# Patient Record
Sex: Male | Born: 1950 | Race: White | Hispanic: No | Marital: Married | Smoking: Former smoker
Health system: Southern US, Community
[De-identification: ages and names within clinical notes are randomized; demographics above are authoritative.]

## PROBLEM LIST (undated history)

## (undated) DIAGNOSIS — T80219A Unspecified infection due to central venous catheter, initial encounter: Secondary | ICD-10-CM

## (undated) DIAGNOSIS — K3184 Gastroparesis: Secondary | ICD-10-CM

## (undated) DIAGNOSIS — E119 Type 2 diabetes mellitus without complications: Secondary | ICD-10-CM

## (undated) DIAGNOSIS — L97509 Non-pressure chronic ulcer of other part of unspecified foot with unspecified severity: Secondary | ICD-10-CM

## (undated) DIAGNOSIS — Z720 Tobacco use: Secondary | ICD-10-CM

## (undated) DIAGNOSIS — I358 Other nonrheumatic aortic valve disorders: Secondary | ICD-10-CM

## (undated) DIAGNOSIS — R9430 Abnormal result of cardiovascular function study, unspecified: Secondary | ICD-10-CM

## (undated) DIAGNOSIS — I2510 Atherosclerotic heart disease of native coronary artery without angina pectoris: Secondary | ICD-10-CM

## (undated) DIAGNOSIS — I739 Peripheral vascular disease, unspecified: Secondary | ICD-10-CM

## (undated) HISTORY — DX: Abnormal result of cardiovascular function study, unspecified: R94.30

## (undated) HISTORY — DX: Tobacco use: Z72.0

## (undated) HISTORY — DX: Unspecified infection due to central venous catheter, initial encounter: T80.219A

## (undated) HISTORY — DX: Other nonrheumatic aortic valve disorders: I35.8

## (undated) HISTORY — DX: Peripheral vascular disease, unspecified: I73.9

## (undated) HISTORY — PX: CHOLECYSTECTOMY: SHX55

## (undated) HISTORY — PX: TONSILLECTOMY: SUR1361

## (undated) HISTORY — DX: Type 2 diabetes mellitus without complications: E11.9

## (undated) HISTORY — DX: Non-pressure chronic ulcer of other part of unspecified foot with unspecified severity: L97.509

## (undated) HISTORY — PX: VASECTOMY: SHX75

## (undated) HISTORY — DX: Gastroparesis: K31.84

## (undated) HISTORY — DX: Atherosclerotic heart disease of native coronary artery without angina pectoris: I25.10

## (undated) HISTORY — PX: KNEE SURGERY: SHX244

## (undated) HISTORY — PX: CATARACT EXTRACTION: SUR2

---

## 2010-06-07 ENCOUNTER — Ambulatory Visit: Payer: Self-pay | Admitting: Vascular Surgery

## 2010-06-07 ENCOUNTER — Encounter: Payer: Self-pay | Admitting: Cardiology

## 2010-06-28 ENCOUNTER — Ambulatory Visit (HOSPITAL_COMMUNITY)
Admission: RE | Admit: 2010-06-28 | Discharge: 2010-06-28 | Payer: Self-pay | Source: Home / Self Care | Attending: Surgery | Admitting: Surgery

## 2010-06-28 ENCOUNTER — Telehealth (INDEPENDENT_AMBULATORY_CARE_PROVIDER_SITE_OTHER): Payer: Self-pay | Admitting: Radiology

## 2010-06-29 ENCOUNTER — Encounter: Payer: Self-pay | Admitting: Cardiology

## 2010-06-29 ENCOUNTER — Encounter (HOSPITAL_COMMUNITY)
Admission: RE | Admit: 2010-06-29 | Discharge: 2010-08-16 | Payer: Self-pay | Source: Home / Self Care | Attending: Vascular Surgery | Admitting: Vascular Surgery

## 2010-06-29 ENCOUNTER — Ambulatory Visit: Payer: Self-pay

## 2010-06-29 ENCOUNTER — Encounter: Payer: Self-pay | Admitting: *Deleted

## 2010-06-30 ENCOUNTER — Encounter: Payer: Self-pay | Admitting: Cardiology

## 2010-06-30 DIAGNOSIS — E119 Type 2 diabetes mellitus without complications: Secondary | ICD-10-CM | POA: Insufficient documentation

## 2010-06-30 DIAGNOSIS — K3184 Gastroparesis: Secondary | ICD-10-CM

## 2010-07-01 ENCOUNTER — Encounter: Payer: Self-pay | Admitting: Cardiology

## 2010-07-01 ENCOUNTER — Ambulatory Visit: Payer: Self-pay

## 2010-07-01 ENCOUNTER — Ambulatory Visit: Payer: Self-pay | Admitting: Cardiology

## 2010-07-01 ENCOUNTER — Ambulatory Visit (HOSPITAL_COMMUNITY)
Admission: RE | Admit: 2010-07-01 | Discharge: 2010-07-01 | Payer: Self-pay | Source: Home / Self Care | Attending: Cardiology | Admitting: Cardiology

## 2010-07-01 DIAGNOSIS — R011 Cardiac murmur, unspecified: Secondary | ICD-10-CM

## 2010-07-15 ENCOUNTER — Inpatient Hospital Stay (HOSPITAL_COMMUNITY)
Admission: RE | Admit: 2010-07-15 | Discharge: 2010-07-21 | Payer: Self-pay | Source: Home / Self Care | Attending: Vascular Surgery | Admitting: Vascular Surgery

## 2010-07-20 LAB — GLUCOSE, CAPILLARY
Glucose-Capillary: 372 mg/dL — ABNORMAL HIGH (ref 70–99)
Glucose-Capillary: 378 mg/dL — ABNORMAL HIGH (ref 70–99)
Glucose-Capillary: 297 mg/dL — ABNORMAL HIGH (ref 70–99)
Glucose-Capillary: 177 mg/dL — ABNORMAL HIGH (ref 70–99)
Glucose-Capillary: 326 mg/dL — ABNORMAL HIGH (ref 70–99)

## 2010-07-20 LAB — CBC
HCT: 28.8 % — ABNORMAL LOW (ref 39.0–52.0)
Hemoglobin: 9.7 g/dL — ABNORMAL LOW (ref 13.0–17.0)
MCH: 30.4 pg (ref 26.0–34.0)
MCHC: 33.7 g/dL (ref 30.0–36.0)
MCV: 90.3 fL (ref 78.0–100.0)
Platelets: 221 10*3/uL (ref 150–400)
RBC: 3.19 MIL/uL — ABNORMAL LOW (ref 4.22–5.81)
RDW: 14.6 % (ref 11.5–15.5)
WBC: 14.8 10*3/uL — ABNORMAL HIGH (ref 4.0–10.5)

## 2010-07-20 LAB — HEMOCCULT GUIAC POC 1CARD (OFFICE): Fecal Occult Bld: NEGATIVE

## 2010-07-21 LAB — HEMOGLOBIN A1C
Hgb A1c MFr Bld: 7.4 % — ABNORMAL HIGH (ref <5.7)
Mean Plasma Glucose: 166 mg/dL — ABNORMAL HIGH (ref <117)

## 2010-07-21 LAB — GLUCOSE, CAPILLARY
Glucose-Capillary: 447 mg/dL — ABNORMAL HIGH (ref 70–99)
Glucose-Capillary: 291 mg/dL — ABNORMAL HIGH (ref 70–99)

## 2010-08-01 ENCOUNTER — Ambulatory Visit: Admit: 2010-08-01 | Payer: Self-pay | Admitting: Vascular Surgery

## 2010-08-08 ENCOUNTER — Ambulatory Visit
Admission: RE | Admit: 2010-08-08 | Discharge: 2010-08-08 | Payer: Self-pay | Source: Home / Self Care | Attending: Vascular Surgery | Admitting: Vascular Surgery

## 2010-08-08 ENCOUNTER — Ambulatory Visit: Admit: 2010-08-08 | Payer: Self-pay | Admitting: Vascular Surgery

## 2010-08-09 NOTE — Assessment & Plan Note (Signed)
OFFICE VISIT  Walter Hinton, Walter Hinton DOB:  1950-07-24                                       08/08/2010 [id number]#:[id number]  The patient returns today for post aortobifemoral bypass graft done by me on December 30th for a gangrenous ulcer on the right first toe.  His aortobifemoral grafts continue to function nicely.  He has had slow improvement in his appetite because of the chronic gastroparesis but no different than his preoperative state.  He has had no chills, fever but states that both feet are much warmer than before.  Bowel habits have returned to normal.  PHYSICAL EXAMINATION:  Today, blood pressure 115/71, heart rate 91, respirations 18.  Incisions have all healed nicely.  Abdominal clips were removed.  He has 3+ femoral pulses bilaterally, with no popliteal or distal pulses.  He does have a gangrenous ulcer on the medial aspect of the right first toe which is quite extensive but no surrounding cellulitis or fluctuance.  He has documented osteomyelitis in the distal phalanx previously.  I think the plan will be to proceed with right femoral-popliteal bypass grafting and a right first toe amputation to be done next Thursday, February 2nd.  The risks and benefits have been thoroughly discussed with the patient.  He would like to proceed.    Quita Skye Hart Rochester, M.D. Electronically Signed  JDL/MEDQ  D:  08/08/2010  T:  08/09/2010  Job:  [id number]

## 2010-08-16 LAB — URINE MICROSCOPIC-ADD ON

## 2010-08-16 LAB — COMPREHENSIVE METABOLIC PANEL
ALT: 19 U/L (ref 0–53)
AST: 28 U/L (ref 0–37)
Albumin: 3.5 g/dL (ref 3.5–5.2)
Alkaline Phosphatase: 119 U/L — ABNORMAL HIGH (ref 39–117)
BUN: 16 mg/dL (ref 6–23)
CO2: 24 mEq/L (ref 19–32)
Calcium: 9.1 mg/dL (ref 8.4–10.5)
Chloride: 103 mEq/L (ref 96–112)
Creatinine, Ser: 0.92 mg/dL (ref 0.4–1.5)
GFR calc Af Amer: >60 mL/min (ref >60)
GFR calc non Af Amer: >60 mL/min (ref >60)
Glucose, Bld: 306 mg/dL — ABNORMAL HIGH (ref 70–99)
Potassium: 4.3 mEq/L (ref 3.5–5.1)
Sodium: 136 mEq/L (ref 135–145)
Total Bilirubin: 0.2 mg/dL — ABNORMAL LOW (ref 0.3–1.2)
Total Protein: 6.2 g/dL (ref 6.0–8.3)

## 2010-08-16 LAB — URINALYSIS, ROUTINE W REFLEX MICROSCOPIC
Hgb urine dipstick: NEGATIVE
Ketones, ur: 15 mg/dL — AB
Leukocytes, UA: NEGATIVE
Nitrite: NEGATIVE
Protein, ur: NEGATIVE mg/dL
Specific Gravity, Urine: 1.026 (ref 1.005–1.030)
Urine Glucose, Fasting: >1000 mg/dL — AB
Urobilinogen, UA: 0.2 mg/dL (ref 0.0–1.0)
pH: 6 (ref 5.0–8.0)

## 2010-08-16 LAB — CBC
HCT: 33.7 % — ABNORMAL LOW (ref 39.0–52.0)
Hemoglobin: 10.9 g/dL — ABNORMAL LOW (ref 13.0–17.0)
MCH: 29.9 pg (ref 26.0–34.0)
MCHC: 32.3 g/dL (ref 30.0–36.0)
MCV: 92.6 fL (ref 78.0–100.0)
Platelets: 267 10*3/uL (ref 150–400)
RBC: 3.64 MIL/uL — ABNORMAL LOW (ref 4.22–5.81)
RDW: 15 % (ref 11.5–15.5)
WBC: 7.9 10*3/uL (ref 4.0–10.5)

## 2010-08-16 LAB — PROTIME-INR
INR: 1 (ref 0.00–1.49)
Prothrombin Time: 13.4 seconds (ref 11.6–15.2)

## 2010-08-16 LAB — SURGICAL PCR SCREEN
MRSA, PCR: NEGATIVE
Staphylococcus aureus: NEGATIVE

## 2010-08-16 LAB — APTT: aPTT: >200 seconds (ref 24–37)

## 2010-08-18 ENCOUNTER — Inpatient Hospital Stay (HOSPITAL_COMMUNITY)
Admission: RE | Admit: 2010-08-18 | Discharge: 2010-08-21 | DRG: 796 | Disposition: A | Discharge status: Home / Self Care | Payer: Federal, State, Local not specified - PPO | Attending: Vascular Surgery | Admitting: Vascular Surgery

## 2010-08-18 ENCOUNTER — Other Ambulatory Visit: Payer: Self-pay | Admitting: Vascular Surgery

## 2010-08-18 ENCOUNTER — Inpatient Hospital Stay (HOSPITAL_COMMUNITY): Payer: Federal, State, Local not specified - PPO

## 2010-08-18 DIAGNOSIS — I70269 Atherosclerosis of native arteries of extremities with gangrene, unspecified extremity: Secondary | ICD-10-CM | POA: Diagnosis present

## 2010-08-18 DIAGNOSIS — Z794 Long term (current) use of insulin: Secondary | ICD-10-CM

## 2010-08-18 DIAGNOSIS — I779 Disorder of arteries and arterioles, unspecified: Secondary | ICD-10-CM

## 2010-08-18 DIAGNOSIS — K3184 Gastroparesis: Secondary | ICD-10-CM | POA: Diagnosis present

## 2010-08-18 DIAGNOSIS — E1159 Type 2 diabetes mellitus with other circulatory complications: Principal | ICD-10-CM | POA: Diagnosis present

## 2010-08-18 LAB — GLUCOSE, CAPILLARY
Glucose-Capillary: 314 mg/dL — ABNORMAL HIGH (ref 70–99)
Glucose-Capillary: 94 mg/dL (ref 70–99)
Glucose-Capillary: 169 mg/dL — ABNORMAL HIGH (ref 70–99)

## 2010-08-18 LAB — APTT: aPTT: 57 seconds — ABNORMAL HIGH (ref 24–37)

## 2010-08-18 NOTE — Assessment & Plan Note (Signed)
Summary: NP6/ABN STRESS/OK PER DR KATZ   Visit Type:  Initial Consult Referring Provider:  Betti Cruz, MD , Charlynne Pander, MD Primary Provider:  Hildred Laser, MD  CC:  cardiac evaluation and clearance.  History of Present Illness: Patient is referred for cardiac evaluation and clearance of vascular surgery. He has a nonhealing foot ulcer and severe peripheral vascular disease. Historically there is no known coronary disease.  However the patient had a nuclear scan which shows definite evidence of an inferior scar with some peri-infarct ischemia.  The patient has not had chest pain.  He is limited  with multiple medical problems and spends a good bit of his time in bed.  He is deconditioned.  Therefore assessment of his exercise capacity is limited.  He does have diabetes.  Preventive Screening-Counseling & Management  Alcohol-Tobacco     Smoking Status: quit      Drug Use:  yes.    Current Medications (verified): 1)  Marinol 10 Mg Caps (Dronabinol) .... Two Times A Day 2)  Aspirin 81 Mg Tbec (Aspirin) .... Take One Tablet By Mouth Daily 3)  Lantus 100 Unit/ml Soln (Insulin Glargine) .... As Directed 4)  Humalog 100 Unit/ml Soln (Insulin Lispro (Human)) .... As Directed 5)  Tramadol Hcl 50 Mg Tabs (Tramadol Hcl) .... As Needed 6)  Bactrim Ds 800-160 Mg Tabs (Sulfamethoxazole-Trimethoprim) .... Two Times A Day 7)  Phenergan 25 Mg/ml Soln (Promethazine Hcl) .... As Needed 8)  Promethazine Hcl 25 Mg Tabs (Promethazine Hcl) .... As Needed 9)  Zofran 8 Mg Tabs (Ondansetron Hcl) .... As Needed 10)  Lisinopril 10 Mg Tabs (Lisinopril) .... Take One Tablet By Mouth Daily 11)  Simvastatin 40 Mg Tabs (Simvastatin) .... Take 1/2 Tablet By Mouth Daily At Bedtime 12)  Alprazolam 0.25 Mg Tabs (Alprazolam) .... At Bedtime 13)  Compazine .... Inj As Needed  Allergies (verified): 1)  ! * Emycin 2)  ! Zithromax  Past History:  Family History: Last updated: 07/01/2010 Family History of Diabetes:  sister  Social History: Last updated: 07/01/2010 Retired  Married  Alcohol Use - no Drug Use - yes -- marijuana in the past Tobacco Use - Former.  --- 12/ 02/2010  Past Medical History: DM Gastroparesis Port-A-Cath  left chest Tobacco abuse   stopped for 10 years but rstarted April, 2011 PAD    probable severe aortoiliac, femoral, popliteal, and tibial disease. EF 48%  nuclear... June 30, 2010 Inferior scar   .Marland Kitchen.. presumed CAD.... nuclear.... large inferior scar with partial reversibility Murmur   aortic valve sclerosis.Marland Kitchen. echo... July 01, 2010 foot ulcer   nonhealing  Past Surgical History: R knee miniscus Cataract Extraction Cholecystectomy Tonsillectomy Vasectomy  Family History: Family History of Diabetes: sister  Social History: Retired  Married  Alcohol Use - no Drug Use - yes -- marijuana in the past Tobacco Use - Former.  --- 12/ 02/2010 Drug Use:  yes Smoking Status:  quit  Review of Systems       Patient denies fever, chills, headache, sweats, rash, change in vision, change in hearing, chest pain, urinary symptoms.  He has chronic nausea related to his gastroparesis.  All of the systems are reviewed and are negative.  Vital Signs:  Patient profile:   60 year old male Height:      69 inches Weight:      122 pounds BMI:     18.08 Pulse rate:   76 / minute BP sitting:   110 / 60  (left arm)  Cuff size:   regular  Vitals Entered By: Hardin Negus, RMA (July 01, 2010 1:43 PM)  Physical Exam  General:  the patient is thin but stable. Head:  head is atraumatic. Eyes:  no xanthelasma. Neck:  no jugular venous distention. Chest Wall:  the patient has a Port-A-Cath in his left anterior chest with an IV catheter in it Lungs:  Lungs are clear.  Respiratory effort is nonlabored. Heart:  Cardiac exam reveals S1 and S2.  There is a 3/6 crescendo decrescendo systolic murmur along the left sternal border Abdomen:  Abdomen is soft. Msk:  patient is  wearing a walking boot on his right foot. Extremities:  patient has a nonhealing ulcer on right foot. Skin:  no skin rashes. Psych:  patient is oriented to person time and place.  Affect is normal.  He is here with family member today.   Impression & Recommendations:  Problem # 1:  * FOOT ULCER NONHEALING The patient definitely needs vascular surgery.  This is being planned.  Problem # 2:  MURMUR (ICD-785.2)  The patient has a systolic murmur.  He may have aortic stenosis.  He is not aware of a murmur in the past.  We're adjusting his scheduled to proceed with a two-dimensional echo today to learn more about his valvular status.  Orders: Echocardiogram (Echo) The patient came back this afternoon for a two-dimensional echo.  The study is not read officially yet.  I have personally reviewed the images.  He has severe hypokinesis of the inferior wall.  Ejection fraction is in the 45% range.  There is aortic valve sclerosis but no significant stenosis.  The murmur heard is from aortic valve sclerosis.  Problem # 3:  * PAD The patient has severe peripheral arterial disease.  Proceeding with surgery for this certainly would be indicated.  It is his major problem at this time.  Problem # 4:  GASTROPARESIS (ICD-536.3) This is a significant problem and affects the patient's nutrition and hydration.  Problem # 5:  * INFERIOR SCAR... NUCLEAR.... LARGE I have carefully reviewed the patient's stress nuclear report.  I reviewed the images on the computer.  I certainly agree with the reading.  Patient has a large inferior wall scar.  There is peri-infarct ischemia.  There is no ischemia in the other distributions.  His ejection fraction is 48%.  There is inferior hypokinesis. EKG is done today and reviewed by me.  There is normal sinus rhythm.  There are inferior Q waves.  There is slight anterior J-point elevation. Surgical clearance in this situation is difficult.  It is concerning to learn that he  certainly has coronary disease that was not previously diagnosed.  He has never had symptoms of a heart attack.  His exercise tolerance is limited by his overall situation.  I have carefully consider whether cardiac catheterization should be done.  However he does not have significant symptoms.  Also his nuclear scan shows a defect with peri-infarct ischemia but no high risk abnormalities in other distributions.  Therefore it is appropriate to proceed with his vascular surgery as opposed to doing a further cardiac workup.  Echo is to be done today to be sure he does not have critical aortic stenosis.  The assessment will be completed after he has an echo. Today I have reviewed the outside information from his vascular surgeon.  I spoke at length with the patient and his family and consider all aspects of his care.  I personally reviewed his  nuclear images.  Problem # 6:  * CLEARANCE FOR VASCULAR SURGERY Considering all data available I feel the patient can be cleared for vascular surgery.  He has had an MI in the past.  He has a fixed inferior defect with mild.  Infarct ischemia.  There is no high risk ischemia.  His ejection fraction is in the 45% range.  He has aortic valve sclerosis but no stenosis. Prescriptions: BACTRIM DS 800-160 MG TABS (SULFAMETHOXAZOLE-TRIMETHOPRIM) two times a day  #60 x 1   Entered by:   Ledon Snare, RN   Authorized by:   Talitha Givens, MD, [facility]   Signed by:   Ledon Snare, RN on 07/01/2010   Method used:   Electronically to        [facility]* (retail)       [address]       [place], [place]  [id number]       Ph: [phone]       Fax: [phone]   RxID:   [id number]   Appended Document: NP6/ABN STRESS/OK PER DR KATZ per Dr Andi Hence with him in 3 months--appt made for pt with Dr Myrtis Ser 10/03/10 at 2pm--pt aware  Appended Document: NP6/ABN STRESS/OK PER DR KATZ Bactrim Rx was not received by pharmacy therefore I have called in the above  prescription.  Pt's wife appreciated the return call and follow up.    Appended Document: NP6/ABN STRESS/OK PER DR Myrtis Ser Thanks  Appended Document: NP6/ABN STRESS/OK PER DR Myrtis Ser I left message with Darel Hong at Dr Candie Chroman office that Dr Myrtis Ser had cleared pt for surgery

## 2010-08-18 NOTE — Assessment & Plan Note (Signed)
Summary: Cardiology Nuclear Testing  Nuclear Med Background Indications for Stress Test: Evaluation for Ischemia, Surgical Clearance  Indications Comments: Pending Aortic Byass surgery 07/01/10.  History: Abnormal EKG   Symptoms: Chest Pain, DOE, Fatigue, Nausea    Nuclear Pre-Procedure Cardiac Risk Factors: IDDM Type 1, PVD, Smoker Caffeine/Decaff Intake: None NPO After: 6:00 PM Lungs: clear IV 0.9% NS with Angio Cath: 24g     IV Site: R Forearm IV Started by: Irean Hong, RN Chest Size (in) 36     Height (in): 69 Weight (lb): 119 BMI: 17.64 Tech Comments: The patient continues to have nausea and vomiting post lexiscan. The patient has gastroparesis with chronic nausea, but he left his nausea medications at home.  BP 160/92, HR103, O2 Sat 98%. Zofran (ondansetron) 4 mg IV given per order by Dr. Golden Circle. Patsy Edwards,RN.  Nuclear Med Study 1 or 2 day study:  1 day     Stress Test Type:  Eugenie Birks Reading MD:  Cassell Clement, MD     Referring MD:  Hart Rochester Resting Radionuclide:  Technetium 65m Tetrofosmin     Resting Radionuclide Dose:  11.0 mCi  Stress Radionuclide:  Technetium 73m Tetrofosmin     Stress Radionuclide Dose:  33.0 mCi   Stress Protocol  Max Systolic BP: 212 mm Hg Lexiscan: 0.4 mg   Stress Test Technologist:  Milana Na, EMT-P     Nuclear Technologist:  Domenic Polite, CNMT  Rest Procedure  Myocardial perfusion imaging was performed at rest 45 minutes following the intravenous administration of Technetium 4m Tetrofosmin.  Stress Procedure  The patient received IV Lexiscan 0.4 mg over 15-seconds.  Technetium 32m Tetrofosmin injected at 30-seconds.  There were non specific changes with infusion.  Quantitative spect images were obtained after a 45 minute delay.  QPS Raw Data Images:  Normal; no motion artifact; normal heart/lung ratio. Stress Images:  There is decreased uptake in the inferior wall Rest Images:  There is decreased uptake in the  inferior wall. Subtraction (SDS):  Infero-apical scar with partial reversibility. Transient Ischemic Dilatation:  1.12  (Normal <1.22)  Lung/Heart Ratio:  0.32  (Normal <0.45)  Quantitative Gated Spect Images QGS EDV:  90 ml QGS ESV:  47 ml QGS EF:  48 % QGS cine images:  Mild inferior wall hypokinesis.  Findings Moderate risk nuclear study Clinically Abnormal (chest pain, ST abnormality, hypotension) Evidence for inferior ischemia  Evidence for inferior infarct  Evidence for LV Dysfunction LV Dysfunction    Overall Impression  Exercise Capacity: Lexiscan with no exercise. BP Response: Normal blood pressure response. Clinical Symptoms: Dyspnea.  No chest pain. Nausea and vomiting requiring IV zofran. ECG Impression: No significant ST segment change suggestive of ischemia. Overall Impression: Abnormal stress nuclear study. Overall Impression Comments: Large area of inferior wall scar with partial reversibility.  Mild LV systolic dysfunction.  Appended Document: Cardiology Nuclear Testing copy faxed to Dr. Hart Rochester

## 2010-08-18 NOTE — Miscellaneous (Signed)
  Clinical Lists Changes  Problems: Added new problem of DM (ICD-250.00) Added new problem of GASTROPARESIS (ICD-536.3) Added new problem of * PAD Added new problem of * EF 48% Added new problem of * INFERIOR SCAR... NUCLEAR.... LARGE Observations: Added new observation of PAST MED HX: DM Gastroparesis Tobacco abuse   stopped for 10 years but rstarted April, 2011 PAD    probable severe aortoiliac, femoral, popliteal, and tibial disease. EF 48%  nuclear... June 30, 2010 Inferior scar   .Marland Kitchen.. presumed CAD.... nuclear.... large inferior scar with partial reversibility (06/30/2010 17:50) Added new observation of REFERRING MD: Betti Cruz, MD (06/30/2010 17:50)       Past History:  Past Medical History: DM Gastroparesis Tobacco abuse   stopped for 10 years but rstarted April, 2011 PAD    probable severe aortoiliac, femoral, popliteal, and tibial disease. EF 48%  nuclear... June 30, 2010 Inferior scar   .Marland Kitchen.. presumed CAD.... nuclear.... large inferior scar with partial reversibility

## 2010-08-18 NOTE — Progress Notes (Signed)
Summary: Nuc Pre-Procedure  Phone Note Outgoing Call Call back at Home Phone [phone]   Call placed by: Lady Saucier Call placed to: Patient Reason for Call: Confirm/change Appt Summary of Call: Left message with information on [employer] Information Sheet (see scanned document for details).      Nuclear Med Background Indications for Stress Test: Evaluation for Ischemia, Surgical Clearance  Indications Comments: Pending Aortic Byass surgery 07/01/10.       Nuclear Pre-Procedure Cardiac Risk Factors: IDDM Type 1, PVD, Smoker

## 2010-08-18 NOTE — Letter (Signed)
Summary: VV New Patient Consultation  VV New Patient Consultation   Imported By: Marylou Mccoy 07/25/2010 17:38:23  _____________________________________________________________________  External Attachment:    Type:   Image     Comment:   External Document

## 2010-08-19 LAB — BASIC METABOLIC PANEL
CO2: 24 mEq/L (ref 19–32)
Calcium: 8.5 mg/dL (ref 8.4–10.5)
GFR calc Af Amer: >60 mL/min (ref >60)
GFR calc non Af Amer: >60 mL/min (ref >60)
Glucose, Bld: 130 mg/dL — ABNORMAL HIGH (ref 70–99)
Potassium: 4.2 mEq/L (ref 3.5–5.1)
Sodium: 136 mEq/L (ref 135–145)

## 2010-08-19 LAB — CBC
HCT: 28.4 % — ABNORMAL LOW (ref 39.0–52.0)
Hemoglobin: 9.3 g/dL — ABNORMAL LOW (ref 13.0–17.0)
MCHC: 32.7 g/dL (ref 30.0–36.0)
RDW: 15.4 % (ref 11.5–15.5)
WBC: 9.8 10*3/uL (ref 4.0–10.5)

## 2010-08-19 LAB — GLUCOSE, CAPILLARY: Glucose-Capillary: 200 mg/dL — ABNORMAL HIGH (ref 70–99)

## 2010-08-19 NOTE — Op Note (Signed)
Walter Hinton, Walter Hinton                   ACCOUNT NO.:  [id number]  MEDICAL RECORD NO.:  [id number]           PATIENT TYPE:  I  LOCATION:  3310                         FACILITY:  [facility]  PHYSICIAN:  Quita Skye. Hart Rochester, M.D.  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  OPERATION: 1. Right common femoral-to-popliteal (below knee) bypass using a     nonreversed - composite saphenous vein graft from right leg with     intraoperative arteriogram. 2. Right first toe amputation.  SURGEON:  Quita Skye. Hart Rochester, MD  FIRST ASSISTANT:  Pecola Leisure, PA  ANESTHESIA:  General endotracheal.  PROCEDURE:  The patient was taken to the operating room, placed in a supine position at which time satisfactory general endotracheal anesthesia was administered.  Right leg was prepped with Betadine scrub and solution, draped in routine sterile manner.  Longitudinal incision was made through the previous scar in the right inguinal area where aortobifemoral graft had been placed about 3-4 weeks earlier.  The distal right limb of aortobifemoral graft was exposed anteriorly and laterally enough to use a partial occlusion clamp for the proximal anastomosis.  Saphenous vein was exposed at the saphenofemoral junction. It was then removed down to the midcalf through multiple incisions along the medial aspect of the right leg.  It's branch was ligated with 3-0 and 4-0 silk ties and divided, it was removed gently, dilated with heparinized saline, marked for orientation purposes.  It was an excellent vein with the exception of a 4-cm segment in the midportion, which did require excision.  After spatulating both  ends, a new end-to- end anastomosis was done using two continuous 6-0 Prolene to remove the smaller piece of the vein and this was quite satisfactory.  The popliteal artery was then exposed in the below-knee position.  It was diffusely mildly diseased.  It was patent on the angiogram with one- vessel runoff through the peroneal.  Subfascial anatomic tunnel was created and the patient was heparinized.  Partial occlusion clamp was used on the hood of the right limb of aortobifemoral graft in the femoral artery, opened with 15 blade, extended with Potts scissors, proximal end of the vein was spatulated, and anastomosed end-to-side with 6-0 Prolene.  Clamp was then released.  There was good pulse in the first set of competent valves.  Using retrograde valvulotome, the valves were rendered incompetent with resultant excellent flow up the distal end of the vein graft.  The vein was carefully delivered through the tunnel, popliteal artery was opened with a 15 blade, extended with Potts scissors.  It was moderately diseased distally with plaque formation, but was patent on the angiogram.  Vein was carefully measured, spatulated, and anastomosed end-to-side with 6-0 Prolene.  Clamp was then released.  There was an excellent pulse in the vein graft and good Doppler flow, not only  at the anastomosis, but also in the peroneal artery at the ankle.  Protamine was given to reverse the heparin. Intraoperative arteriogram revealed a widely patent anastomosis with one- vessel runoff through the peroneal artery.  Following adequate hemostasis, wound was irrigated with saline, closed in layers with Vicryl in a subcuticular fashion with Dermabond.  Attention was turned to the right foot, which had been prepped earlier and is now re-exposed. There was gangrene of the lateral aspect of the right first toe with osteomyelitis.  Circumferential fishmouth-type incision was made at the base of the toe, back with  healthy tissue, was carried down through skin and subcutaneous tissue.  There was excellent arterial pulsatile bleeding.  It was carried down the bone, which was cleaned proximally with periosteal elevator and the MP joint was disarticulated.  The bone smoothed with a rongeur and the tissue came together quite nicely with interrupted 3-0 Vicryl and interrupted 3-0 nylon vertical mattress sutures.  Sterile dressing was applied.  The patient was taken to the recovery room in a stable condition.     Quita Skye Hart Rochester, M.D.     JDL/MEDQ  D:  08/18/2010  T:  08/19/2010  Job:  [id number]  Electronically Signed by Josephina Gip M.D. on 08/19/2010 12:49:55 PM

## 2010-08-20 LAB — GLUCOSE, CAPILLARY
Glucose-Capillary: 263 mg/dL — ABNORMAL HIGH (ref 70–99)
Glucose-Capillary: 98 mg/dL (ref 70–99)

## 2010-08-20 LAB — TYPE AND SCREEN
ABO/RH(D): O POS
Antibody Screen: NEGATIVE
Unit division: 0
Unit division: 0

## 2010-08-21 LAB — GLUCOSE, CAPILLARY
Glucose-Capillary: 500 mg/dL — ABNORMAL HIGH (ref 70–99)
Glucose-Capillary: 426 mg/dL — ABNORMAL HIGH (ref 70–99)
Glucose-Capillary: 304 mg/dL — ABNORMAL HIGH (ref 70–99)

## 2010-08-21 LAB — GLUCOSE, RANDOM: Glucose, Bld: 326 mg/dL — ABNORMAL HIGH (ref 70–99)

## 2010-08-22 LAB — POCT I-STAT GLUCOSE
Glucose, Bld: 91 mg/dL (ref 70–99)
Operator id: 155201

## 2010-08-23 LAB — POCT I-STAT 4, (NA,K, GLUC, HGB,HCT)
Glucose, Bld: 282 mg/dL — ABNORMAL HIGH (ref 70–99)
Glucose, Bld: 176 mg/dL — ABNORMAL HIGH (ref 70–99)
HCT: 31 % — ABNORMAL LOW (ref 39.0–52.0)
Hemoglobin: 10.5 g/dL — ABNORMAL LOW (ref 13.0–17.0)
Potassium: 3.8 mEq/L (ref 3.5–5.1)

## 2010-09-05 NOTE — Discharge Summary (Signed)
NAMEJERREL, Walter Hinton NO.:  [id number]  MEDICAL RECORD NO.:  [id number]           PATIENT TYPE:  I  LOCATION:  2032                         FACILITY:  [facility]  PHYSICIAN:  Quita Skye. Hart Rochester, M.D.  DATE OF BIRTH:  1950/11/29  DATE OF ADMISSION:  08/18/2010 DATE OF DISCHARGE:  08/21/2010                              DISCHARGE SUMMARY   PAST MEDICAL HISTORY AND DISCHARGE DIAGNOSES: 1. Peripheral vascular disease with a nonhealing right toe wound,     status post aortobifemoral bypass graft on July 15, 2010, and     status post right femoral popliteal bypass graft with right first     toe amputation on August 18, 2010. 2. Type 1 diabetes. 3. Gastroparesis. 4. Bilateral cataracts. 5. Torn anterior cruciate ligament in the left leg. 6. History of tobacco abuse.  BRIEF HISTORY:  The patient is a 60 year old male with type 1 diabetes mellitus who was evaluated by Dr. Hart Rochester for a nonhealing wound of theright great toe that had been present for approximately a year.  The patient had undergone an extensive workup and per Dr. Hart Rochester was to proceed with an aortobifemoral bypass graft on July 15, 2010.  The patient returned for followup on August 08, 2010, and was doing well from that procedure.  The toe continued to remain gangrenous and nonhealing.  At that time, Dr. Hart Rochester felt the patient should proceed with a right femoral popliteal graft with amputation of the right first toe.  HOSPITAL COURSE:  The patient was admitted and taken to the OR on August 18, 2010, for a right common femoral to below-knee popliteal bypass using nonreversed saphenous vein as well as a right first toe amputation.  The patient tolerated the procedure well and was hemodynamically stable immediately postoperatively.  The patient was transferred from the OR to postanesthesia care unit in stable condition. The patient was extubated without complication and woke up from anesthesia  neurologically intact.  The patient's postoperative course progressed as expected.  His diet was resumed and he was able to ambulate with a Darco shoe.  The incisions remained clean, dry, and intact.  The toe amp site was stable as well.  There was a 2+ graft pulse noted.  The patient remained afebrile with stable vital signs.  On August 21, 2010, postoperative day 3, the patient was doing well and wanted to go home.  He was ambulating with a rolling walker and a Darco shoe.  His incisions are healing nicely and he was afebrile with stable vital signs.  The patient was then discharged home in stable condition at that time.  LABORATORY DATA:  CBC on August 19, 2010, white count 9.8, hemoglobin 9.3, hematocrit 28.4, and platelets 231.  BMP on August 19, 2010, sodium 136, potassium 4.2, BUN 9, and creatinine 0.89.  The patient received specific written discharge instructions regarding diet, activity, and wound care.  He was to follow up with Dr. Hart Rochester approximately 2 weeks after discharge.  The patient will be contacted with the date and time of that appointment.  MEDICATIONS: 1. Percocet 5/325 mg 1-2 q.  4-6 h. p.r.n. pain. 2. Zofran 8 mg p.o. q.6 h. p.r.n. 3. Xanax 0.25 mg at bedtime. 4. Simvastatin 80 mg one-half tab at bedtime. 5. Promethazine 25 mg q.4 h. p.r.n. 6. Prochlorperazine 10 mg q.3 h. p.r.n. 7. Nicotine patch 21 mg OTC transdermally daily. 8. Multivitamin daily. 9. Metanx 2 tabs daily. 10.Lantus 16 units subcu at bedtime. 11.Ibuprofen 800 mg one p.o. t.i.d. p.r.n. 12.Humalog sliding scale t.i.d. p.r.n. 13.Bactrim DS one tab b.i.d. 14.Aspirin 81 mg daily.     Pecola Leisure, PA   ______________________________ Quita Skye Hart Rochester, M.D.    AY/MEDQ  D:  09/01/2010  T:  09/01/2010  Job:  [id number]  Electronically Signed by Pecola Leisure PA on 09/03/2010 09:49:34 AM Electronically Signed by Josephina Gip M.D. on 09/05/2010 12:25:09 PM

## 2010-09-13 ENCOUNTER — Ambulatory Visit (INDEPENDENT_AMBULATORY_CARE_PROVIDER_SITE_OTHER): Payer: Federal, State, Local not specified - PPO | Admitting: Vascular Surgery

## 2010-09-13 DIAGNOSIS — I739 Peripheral vascular disease, unspecified: Secondary | ICD-10-CM

## 2010-09-14 NOTE — Assessment & Plan Note (Signed)
OFFICE VISIT  VERLAND, SPRINKLE B DOB:  10-04-1950                                       09/13/2010 OZHYQ#:[id number]  The patient returns for initial evaluation following his right femoral- popliteal bypass graft and right first toe amputation performed on February 2 for gangrene of the right first toe.  He also had an aortobifemoral bypass graft performed prior to that on December 30.  He is doing very well from both procedures.  He states that his gastroparesis is actually improved as is his appetite.  He has had no chills, fever or discomfort in the right leg since his bypass in the leg.  PHYSICAL EXAMINATION:  Vital signs:  Blood pressure 110/74, heart rate 96, respirations 16.  His right leg has 3+ femoral, popliteal, dorsalis pedis pulse with a well-healed right first toe amputation site.  All of his other incisions in the leg have also healed nicely.  Left leg has 3+ femoral pulses as well.  I think he is making excellent progress.  We will discontinue his sutures from the toe amputation site today.  He will return 3 months for a scan of his right femoral popliteal vein graft.  We will continue to follow him on a regular basis.  He can resume his activities as tolerated.    Quita Skye Hart [place], M.D. Electronically Signed  JDL/MEDQ  D:  09/13/2010  T:  09/14/2010  Job:  [id number]  cc:   Dr. _________ Barb Merino, in [place] [address]. Olene Floss, [place]

## 2010-09-26 LAB — BASIC METABOLIC PANEL
BUN: 12 mg/dL (ref 6–23)
BUN: 16 mg/dL (ref 6–23)
CO2: 14 mEq/L — ABNORMAL LOW (ref 19–32)
CO2: 21 mEq/L (ref 19–32)
CO2: 23 mEq/L (ref 19–32)
Calcium: 7.5 mg/dL — ABNORMAL LOW (ref 8.4–10.5)
Calcium: 7.9 mg/dL — ABNORMAL LOW (ref 8.4–10.5)
Chloride: 103 mEq/L (ref 96–112)
Chloride: 98 mEq/L (ref 96–112)
GFR calc Af Amer: >60 mL/min (ref >60)
GFR calc Af Amer: >60 mL/min (ref >60)
GFR calc non Af Amer: >60 mL/min (ref >60)
GFR calc non Af Amer: >60 mL/min (ref >60)
Glucose, Bld: 211 mg/dL — ABNORMAL HIGH (ref 70–99)
Glucose, Bld: 227 mg/dL — ABNORMAL HIGH (ref 70–99)
Potassium: 3.6 mEq/L (ref 3.5–5.1)
Potassium: 3.6 mEq/L (ref 3.5–5.1)
Potassium: 4 mEq/L (ref 3.5–5.1)
Sodium: 132 mEq/L — ABNORMAL LOW (ref 135–145)
Sodium: 132 mEq/L — ABNORMAL LOW (ref 135–145)

## 2010-09-26 LAB — BLOOD GAS, ARTERIAL
Drawn by: 22251
FIO2: 0.21 %
Patient temperature: 98.6
TCO2: 23 mmol/L (ref 0–100)
pCO2 arterial: 31.8 mmHg — ABNORMAL LOW (ref 35.0–45.0)
pH, Arterial: 7.455 — ABNORMAL HIGH (ref 7.350–7.450)

## 2010-09-26 LAB — CBC
HCT: 21.7 % — ABNORMAL LOW (ref 39.0–52.0)
HCT: 26 % — ABNORMAL LOW (ref 39.0–52.0)
HCT: 26.4 % — ABNORMAL LOW (ref 39.0–52.0)
HCT: 30.7 % — ABNORMAL LOW (ref 39.0–52.0)
HCT: 36.8 % — ABNORMAL LOW (ref 39.0–52.0)
Hemoglobin: 7.6 g/dL — ABNORMAL LOW (ref 13.0–17.0)
Hemoglobin: 8.9 g/dL — ABNORMAL LOW (ref 13.0–17.0)
Hemoglobin: 8.9 g/dL — ABNORMAL LOW (ref 13.0–17.0)
MCH: 30.4 pg (ref 26.0–34.0)
MCH: 30.5 pg (ref 26.0–34.0)
MCH: 30.6 pg (ref 26.0–34.0)
MCH: 30.7 pg (ref 26.0–34.0)
MCHC: 33.3 g/dL (ref 30.0–36.0)
MCHC: 34.1 g/dL (ref 30.0–36.0)
MCHC: 34.2 g/dL (ref 30.0–36.0)
MCHC: 35 g/dL (ref 30.0–36.0)
MCV: 88.7 fL (ref 78.0–100.0)
MCV: 89.4 fL (ref 78.0–100.0)
MCV: 89.7 fL (ref 78.0–100.0)
MCV: 89.9 fL (ref 78.0–100.0)
MCV: 91.5 fL (ref 78.0–100.0)
Platelets: 218 10*3/uL (ref 150–400)
Platelets: 244 10*3/uL (ref 150–400)
Platelets: 298 10*3/uL (ref 150–400)
RBC: 2.77 MIL/uL — ABNORMAL LOW (ref 4.22–5.81)
RBC: 2.92 MIL/uL — ABNORMAL LOW (ref 4.22–5.81)
RBC: 3.44 MIL/uL — ABNORMAL LOW (ref 4.22–5.81)
RBC: 4.02 MIL/uL — ABNORMAL LOW (ref 4.22–5.81)
RDW: 13.4 % (ref 11.5–15.5)
RDW: 14.7 % (ref 11.5–15.5)
WBC: 13.7 10*3/uL — ABNORMAL HIGH (ref 4.0–10.5)
WBC: 16.5 10*3/uL — ABNORMAL HIGH (ref 4.0–10.5)
WBC: 21.3 10*3/uL — ABNORMAL HIGH (ref 4.0–10.5)
WBC: 8.8 10*3/uL (ref 4.0–10.5)

## 2010-09-26 LAB — GLUCOSE, CAPILLARY
Glucose-Capillary: 117 mg/dL — ABNORMAL HIGH (ref 70–99)
Glucose-Capillary: 118 mg/dL — ABNORMAL HIGH (ref 70–99)
Glucose-Capillary: 121 mg/dL — ABNORMAL HIGH (ref 70–99)
Glucose-Capillary: 133 mg/dL — ABNORMAL HIGH (ref 70–99)
Glucose-Capillary: 154 mg/dL — ABNORMAL HIGH (ref 70–99)
Glucose-Capillary: 163 mg/dL — ABNORMAL HIGH (ref 70–99)
Glucose-Capillary: 164 mg/dL — ABNORMAL HIGH (ref 70–99)
Glucose-Capillary: 178 mg/dL — ABNORMAL HIGH (ref 70–99)
Glucose-Capillary: 204 mg/dL — ABNORMAL HIGH (ref 70–99)
Glucose-Capillary: 206 mg/dL — ABNORMAL HIGH (ref 70–99)
Glucose-Capillary: 210 mg/dL — ABNORMAL HIGH (ref 70–99)
Glucose-Capillary: 214 mg/dL — ABNORMAL HIGH (ref 70–99)
Glucose-Capillary: 227 mg/dL — ABNORMAL HIGH (ref 70–99)
Glucose-Capillary: 230 mg/dL — ABNORMAL HIGH (ref 70–99)
Glucose-Capillary: 234 mg/dL — ABNORMAL HIGH (ref 70–99)
Glucose-Capillary: 247 mg/dL — ABNORMAL HIGH (ref 70–99)
Glucose-Capillary: 251 mg/dL — ABNORMAL HIGH (ref 70–99)
Glucose-Capillary: 258 mg/dL — ABNORMAL HIGH (ref 70–99)
Glucose-Capillary: 322 mg/dL — ABNORMAL HIGH (ref 70–99)
Glucose-Capillary: 378 mg/dL — ABNORMAL HIGH (ref 70–99)
Glucose-Capillary: 89 mg/dL (ref 70–99)

## 2010-09-26 LAB — COMPREHENSIVE METABOLIC PANEL
ALT: 27 U/L (ref 0–53)
ALT: 36 U/L (ref 0–53)
Albumin: 3.7 g/dL (ref 3.5–5.2)
Alkaline Phosphatase: 120 U/L — ABNORMAL HIGH (ref 39–117)
Alkaline Phosphatase: 72 U/L (ref 39–117)
BUN: 16 mg/dL (ref 6–23)
CO2: 24 mEq/L (ref 19–32)
Chloride: 101 mEq/L (ref 96–112)
GFR calc non Af Amer: >60 mL/min (ref >60)
Glucose, Bld: 329 mg/dL — ABNORMAL HIGH (ref 70–99)
Glucose, Bld: 80 mg/dL (ref 70–99)
Potassium: 4.2 mEq/L (ref 3.5–5.1)
Potassium: 4.7 mEq/L (ref 3.5–5.1)
Sodium: 133 mEq/L — ABNORMAL LOW (ref 135–145)
Sodium: 136 mEq/L (ref 135–145)
Total Bilirubin: 0.3 mg/dL (ref 0.3–1.2)

## 2010-09-26 LAB — CROSSMATCH: Unit division: 0

## 2010-09-26 LAB — URINALYSIS, ROUTINE W REFLEX MICROSCOPIC
Bilirubin Urine: NEGATIVE
Glucose, UA: >1000 mg/dL — AB
Hgb urine dipstick: NEGATIVE
Specific Gravity, Urine: 1.03 (ref 1.005–1.030)
Urobilinogen, UA: 1 mg/dL (ref 0.0–1.0)

## 2010-09-26 LAB — SURGICAL PCR SCREEN: MRSA, PCR: NEGATIVE

## 2010-09-26 LAB — POCT I-STAT 4, (NA,K, GLUC, HGB,HCT)
Glucose, Bld: 194 mg/dL — ABNORMAL HIGH (ref 70–99)
HCT: 33 % — ABNORMAL LOW (ref 39.0–52.0)
Hemoglobin: 11.2 g/dL — ABNORMAL LOW (ref 13.0–17.0)

## 2010-09-26 LAB — POCT I-STAT, CHEM 8
Chloride: 105 mEq/L (ref 96–112)
Glucose, Bld: 152 mg/dL — ABNORMAL HIGH (ref 70–99)
HCT: 26 % — ABNORMAL LOW (ref 39.0–52.0)
Hemoglobin: 8.8 g/dL — ABNORMAL LOW (ref 13.0–17.0)
Potassium: 3.5 mEq/L (ref 3.5–5.1)
Sodium: 135 mEq/L (ref 135–145)

## 2010-09-26 LAB — URINE MICROSCOPIC-ADD ON

## 2010-09-26 LAB — AMYLASE: Amylase: 118 U/L — ABNORMAL HIGH (ref 0–105)

## 2010-09-26 LAB — TYPE AND SCREEN
ABO/RH(D): O POS
Unit division: 0

## 2010-09-26 LAB — POCT I-STAT GLUCOSE: Glucose, Bld: 234 mg/dL — ABNORMAL HIGH (ref 70–99)

## 2010-09-26 LAB — APTT: aPTT: 31 seconds (ref 24–37)

## 2010-09-26 LAB — PROTIME-INR
INR: 0.92 (ref 0.00–1.49)
INR: 1.1 (ref 0.00–1.49)

## 2010-09-27 LAB — GLUCOSE, CAPILLARY
Glucose-Capillary: 101 mg/dL — ABNORMAL HIGH (ref 70–99)
Glucose-Capillary: 103 mg/dL — ABNORMAL HIGH (ref 70–99)
Glucose-Capillary: 94 mg/dL (ref 70–99)

## 2010-09-27 LAB — POCT I-STAT, CHEM 8
Creatinine, Ser: 1 mg/dL (ref 0.4–1.5)
HCT: 38 % — ABNORMAL LOW (ref 39.0–52.0)
Hemoglobin: 12.9 g/dL — ABNORMAL LOW (ref 13.0–17.0)
Potassium: 4 mEq/L (ref 3.5–5.1)
Sodium: 137 mEq/L (ref 135–145)
TCO2: 25 mmol/L (ref 0–100)

## 2010-09-28 ENCOUNTER — Emergency Department (HOSPITAL_COMMUNITY)
Admission: EM | Admit: 2010-09-28 | Discharge: 2010-09-28 | Disposition: A | Discharge status: Home / Self Care | Payer: Federal, State, Local not specified - PPO | Attending: Emergency Medicine | Admitting: Emergency Medicine

## 2010-09-28 ENCOUNTER — Emergency Department (HOSPITAL_COMMUNITY): Payer: Federal, State, Local not specified - PPO

## 2010-09-28 DIAGNOSIS — E119 Type 2 diabetes mellitus without complications: Secondary | ICD-10-CM | POA: Insufficient documentation

## 2010-09-28 DIAGNOSIS — M25569 Pain in unspecified knee: Secondary | ICD-10-CM | POA: Insufficient documentation

## 2010-09-28 DIAGNOSIS — M79609 Pain in unspecified limb: Secondary | ICD-10-CM | POA: Insufficient documentation

## 2010-09-28 DIAGNOSIS — M949 Disorder of cartilage, unspecified: Secondary | ICD-10-CM | POA: Insufficient documentation

## 2010-09-28 DIAGNOSIS — M1990 Unspecified osteoarthritis, unspecified site: Secondary | ICD-10-CM | POA: Insufficient documentation

## 2010-09-28 DIAGNOSIS — Z794 Long term (current) use of insulin: Secondary | ICD-10-CM | POA: Insufficient documentation

## 2010-09-28 DIAGNOSIS — M899 Disorder of bone, unspecified: Secondary | ICD-10-CM | POA: Insufficient documentation

## 2010-09-28 DIAGNOSIS — M25579 Pain in unspecified ankle and joints of unspecified foot: Secondary | ICD-10-CM | POA: Insufficient documentation

## 2010-10-02 ENCOUNTER — Encounter: Payer: Self-pay | Admitting: Cardiology

## 2010-10-03 ENCOUNTER — Encounter: Payer: Self-pay | Admitting: Cardiology

## 2010-10-03 ENCOUNTER — Ambulatory Visit: Payer: Self-pay | Admitting: Cardiology

## 2010-10-13 NOTE — Miscellaneous (Signed)
  Clinical Lists Changes  Observations: Added new observation of PAST MED HX: DM Gastroparesis Port-A-Cath  left chest Tobacco abuse   stopped for 10 years but rstarted April, 2011 PAD    probable severe aortoiliac, femoral, popliteal, and tibial disease. EF 48%  nuclear... June 30, 2010 /  45-50%  echo... Inferior scar   .Marland Kitchen.. presumed CAD.... nuclear.... large inferior scar with partial reversibility Murmur   aortic valve sclerosis.Marland Kitchen. echo... July 01, 2010 foot ulcer   nonhealing (10/02/2010 16:45) Added new observation of REFERRING MD: Betti Cruz, MD , Charlynne Pander, MD (10/02/2010 16:45) Added new observation of PRIMARY MD: Hildred Laser, MD (10/02/2010 16:45)       Past History:  Past Medical History: DM Gastroparesis Port-A-Cath  left chest Tobacco abuse   stopped for 10 years but rstarted April, 2011 PAD    probable severe aortoiliac, femoral, popliteal, and tibial disease. EF 48%  nuclear... June 30, 2010 /  45-50%  echo... Inferior scar   .Marland Kitchen.. presumed CAD.... nuclear.... large inferior scar with partial reversibility Murmur   aortic valve sclerosis.Marland Kitchen. echo... July 01, 2010 foot ulcer   nonhealing

## 2010-10-13 NOTE — Miscellaneous (Signed)
  Clinical Lists Changes  Observations: Added new observation of PAST MED HX: DM Gastroparesis Port-A-Cath  left chest Tobacco abuse   stopped for 10 years but rstarted April, 2011 PAD    probable severe aortoiliac, femoral, popliteal, and tibial disease. EF 48%  nuclear... June 30, 2010 /  45-50%  echo.Marland KitchenMarland KitchenDecember, 2011...inferior and posterior hypokinesis Inferior scar   .Marland Kitchen.. presumed CAD.... nuclear.... large inferior scar with partial reversibility aortic valve sclerosis.Marland Kitchen. echo... July 01, 2010 /   foot ulcer   nonhealing (10/03/2010 10:05) Added new observation of REFERRING MD: Betti Cruz, MD , Charlynne Pander, MD (10/03/2010 10:05) Added new observation of PRIMARY MD: Hildred Laser, MD (10/03/2010 10:05)       Past History:  Past Medical History: DM Gastroparesis Port-A-Cath  left chest Tobacco abuse   stopped for 10 years but rstarted April, 2011 PAD    probable severe aortoiliac, femoral, popliteal, and tibial disease. EF 48%  nuclear... June 30, 2010 /  45-50%  echo.Marland KitchenMarland KitchenDecember, 2011...inferior and posterior hypokinesis Inferior scar   .Marland Kitchen.. presumed CAD.... nuclear.... large inferior scar with partial reversibility aortic valve sclerosis.Marland Kitchen. echo... July 01, 2010 /   foot ulcer   nonhealing

## 2010-10-18 ENCOUNTER — Ambulatory Visit: Payer: Federal, State, Local not specified - PPO | Admitting: Vascular Surgery

## 2010-10-25 ENCOUNTER — Ambulatory Visit: Payer: Federal, State, Local not specified - PPO | Admitting: Vascular Surgery

## 2010-10-28 ENCOUNTER — Encounter: Payer: Self-pay | Admitting: Cardiology

## 2010-10-31 ENCOUNTER — Encounter: Payer: Self-pay | Admitting: Cardiology

## 2010-10-31 DIAGNOSIS — T80219A Unspecified infection due to central venous catheter, initial encounter: Secondary | ICD-10-CM | POA: Insufficient documentation

## 2010-10-31 DIAGNOSIS — Z720 Tobacco use: Secondary | ICD-10-CM | POA: Insufficient documentation

## 2010-10-31 DIAGNOSIS — I739 Peripheral vascular disease, unspecified: Secondary | ICD-10-CM | POA: Insufficient documentation

## 2010-10-31 DIAGNOSIS — L97509 Non-pressure chronic ulcer of other part of unspecified foot with unspecified severity: Secondary | ICD-10-CM | POA: Insufficient documentation

## 2010-10-31 DIAGNOSIS — I2510 Atherosclerotic heart disease of native coronary artery without angina pectoris: Secondary | ICD-10-CM | POA: Insufficient documentation

## 2010-10-31 DIAGNOSIS — R9430 Abnormal result of cardiovascular function study, unspecified: Secondary | ICD-10-CM | POA: Insufficient documentation

## 2010-10-31 DIAGNOSIS — I358 Other nonrheumatic aortic valve disorders: Secondary | ICD-10-CM | POA: Insufficient documentation

## 2010-11-01 ENCOUNTER — Encounter: Payer: Self-pay | Admitting: Cardiology

## 2010-11-01 ENCOUNTER — Ambulatory Visit (INDEPENDENT_AMBULATORY_CARE_PROVIDER_SITE_OTHER): Payer: Federal, State, Local not specified - PPO | Admitting: Cardiology

## 2010-11-01 DIAGNOSIS — F17200 Nicotine dependence, unspecified, uncomplicated: Secondary | ICD-10-CM

## 2010-11-01 DIAGNOSIS — I739 Peripheral vascular disease, unspecified: Secondary | ICD-10-CM | POA: Insufficient documentation

## 2010-11-01 DIAGNOSIS — I2510 Atherosclerotic heart disease of native coronary artery without angina pectoris: Secondary | ICD-10-CM

## 2010-11-01 DIAGNOSIS — Z720 Tobacco use: Secondary | ICD-10-CM

## 2010-11-01 NOTE — Assessment & Plan Note (Signed)
I have counseled him again to stop smoking.

## 2010-11-01 NOTE — Assessment & Plan Note (Signed)
Fortunately he is doing well.  No change.

## 2010-11-01 NOTE — Patient Instructions (Signed)
Your physician wants you to follow-up in: 9 months You will receive a reminder letter in the mail two months in advance. If you don't receive a letter, please call our office to schedule the follow-up appointment.  

## 2010-11-01 NOTE — Progress Notes (Signed)
HPI Patient is seen for followup of coronary disease.  I had seen him in December, 2011.  We arranged to try to clear him for vascular surgery.  He has presumed coronary disease based on his studies.  He did not have any ischemia on his nuclear scan.  His ejection fraction was 45% by echo with inferior and posterior hypokinesis.  Decision was made to proceed with surgery and this has been done.  He did not have any chest pain.  He is feeling well.  He is smoking 6 cigarettes per day. Allergies  Allergen Reactions  . Azithromycin     REACTION: Reaction not known  . Erythromycin     REACTION: Reaction not known    Current Outpatient Prescriptions  Medication Sig Dispense Refill  . ALPRAZolam (XANAX) 0.25 MG tablet Take 0.25 mg by mouth at bedtime as needed.        Marland Kitchen aspirin 81 MG tablet Take 81 mg by mouth daily.        . insulin glargine (LANTUS) 100 UNIT/ML injection as directed.        . insulin lispro (HUMALOG) 100 UNIT/ML injection as directed.        Marland Kitchen lisinopril (PRINIVIL,ZESTRIL) 10 MG tablet Take 10 mg by mouth daily.        . ondansetron (ZOFRAN) 8 MG tablet Take 8 mg by mouth every 8 (eight) hours as needed.        . promethazine (PHENERGAN) 25 MG tablet Take 25 mg by mouth every 6 (six) hours as needed.        . simvastatin (ZOCOR) 40 MG tablet Take 20 mg by mouth at bedtime.        . traMADol (ULTRAM) 50 MG tablet Take 50 mg by mouth at bedtime.       . dronabinol (MARINOL) 10 MG capsule Take 10 mg by mouth 2 (two) times daily before a meal.        . promethazine (PHENERGAN) 25 MG/ML injection as needed.          History   Social History  . Marital Status: Married    Spouse Name: N/A    Number of Children: N/A  . Years of Education: N/A   Occupational History  . Not on file.   Social History Main Topics  . Smoking status: Current Everyday Smoker    Last Attempt to Quit: 06/23/2010  . Smokeless tobacco: Not on file  . Alcohol Use: No  . Drug Use: Yes    Special:  Marijuana  . Sexually Active: Not on file   Other Topics Concern  . Not on file   Social History Narrative  . No narrative on file    Family History  Problem Relation Age of Onset  . Diabetes      Past Medical History  Diagnosis Date  . DM (diabetes mellitus)   . Gastroparesis   . PAD (peripheral artery disease)     Severe disease / surgery by Dr.  Hart [place] 2001-2002  . Aortic valve sclerosis     echo - 07/01/10  . Foot ulcer     non-healing  . port-a-cath     left chest  . Tobacco abuse     stopped 10 years / restarted 10/2009  . Ejection fraction     45-50% echo,06/2010, inferior and posterior hypo  . CAD (coronary artery disease)     Presumed CAD, nuclear 06/2010,large inferior scar with partial reversibility    Past Surgical  History  Procedure Date  . Knee surgery     miniscus -- right  . Cataract extraction   . Cholecystectomy   . Tonsillectomy   . Vasectomy     ROS  Patient denies fever, chills, headache, sweats, rash, change in, change in hearing, chest pain, cough, nausea vomiting, urinary symptoms.  All other systems are reviewed and are negative.  PHYSICAL EXAM Patient is stable today.  Head is atraumatic.  Lungs are clear.  Respiratory effort is not labored.  There is no jugular venous distention.  Cardiac exam reveals S1 and S2.  There is a soft systolic murmur.  The abdomen is soft.  There is no peripheral edema.  Filed Vitals:   11/01/10 1424  BP: 103/61  Pulse: 76  Resp: 18  Height: 5\' 9"  (1.753 m)    EKG  EKG is done today and reviewed by me.  There is sinus rhythm.  There are inferior Q waves.  No significant change.  ASSESSMENT & PLAN

## 2010-11-01 NOTE — Assessment & Plan Note (Signed)
Is not having any significant cardiac symptoms.  No further workup.  I counseled him to stop smoking completely.

## 2010-11-18 ENCOUNTER — Ambulatory Visit: Payer: Federal, State, Local not specified - PPO

## 2010-11-29 NOTE — Consult Note (Signed)
NEW PATIENT CONSULTATION   Mondesir, DON B  DOB:  1951-02-04                                       06/07/2010  ZOXWR#:[id number]   The patient is a 60 year old male patient with type 1 diabetes mellitus  who has had an ulcer on the right first toe for 1 year.  This has been  treated with local wound care and antibiotics (Bactrim) for the past 4  months with dressing changes for the past 12 months.  It has not healed.  He states that there is infection in the bone although the radiology  report states there is osteopenia in the bone.  The patient does have  calf claudication but does not ambulate long distances.  He denies any  hip and thigh claudication symptoms.   CHRONIC MEDICAL PROBLEMS:  1. Type 1 diabetes mellitus.  2. Gastroparesis.  3. Bilateral cataracts.  4. Torn ACL left leg.  5. Negative for coronary artery disease, hypertension, hyperlipidemia      or stroke.   SOCIAL HISTORY:  He is married, is retired.  He smokes a pack of  cigarettes per day for 35-40 years ago, did stop for 10 years but  restarted 7 months ago.  Does not use alcohol.   FAMILY HISTORY:  Negative coronary disease, diabetes and stroke.   REVIEW OF SYSTEMS:  Positive for anorexia, leg discomfort with walking  and anxiety.  No chest pain, dyspnea on exertion, PND, orthopnea,  chronic cough or other symptoms.  All other systems are negative review  of systems.   PHYSICAL EXAMINATION:  Blood pressure 119/71, heart rate 82,  respirations 20.  General:  He is a well-developed, well-nourished male  who is in no apparent distress alert and x3.  HEENT:  Normal for age.  EOMs intact.  Lungs:  Clear to auscultation.  No rhonchi or wheezing.  Cardiovascular:  Regular rhythm.  No murmurs.  Carotid pulses 3+.  No  audible bruits.  Abdomen:  Soft, nontender with no masses.  Musculoskeletal:  Free of major deformities. Neurologic:  Normal.  Skin:  Exam reveals an ulcer on the medial aspect of  the right first toe about  3 to 3.5 cm in diameter.  There is mild erythema surrounding this.  No  other ulcers are noted and there is an eschar overlying this one.  It is  slightly moist.  Lower extremities reveal 1 to 2+ femoral pulses  bilaterally with no popliteal or distal pulses.   I reviewed the records provided by Dr. Alona Bene including the arterial  Doppler exam performed at [facility] on 10/31 which  revealed ABI on the right of 0.26 and on the left of 0.42 with  monophasic flow throughout.   I think this patient has multilevel disease including severe aortoiliac,  femoral, popliteal and tibial disease.  I explained to the patient and  his wife that this ulcer would never heal without revascularization and  that he would likely require first toe amputation in the future.  I have  scheduled him for an angiogram and possible intervention by Dr. Myra Gianotti  on Tuesday November 29 and then proceed as indicated depending on those  findings.     Quita Skye Hart [place], M.D.  Electronically Signed   JDL/MEDQ  D:  06/07/2010  T:  06/08/2010  Job:  [id number]   cc:  Dr. Raynelle Jan

## 2010-12-06 ENCOUNTER — Ambulatory Visit: Payer: Federal, State, Local not specified - PPO | Admitting: Vascular Surgery

## 2011-02-07 ENCOUNTER — Ambulatory Visit: Payer: Federal, State, Local not specified - PPO | Admitting: Vascular Surgery

## 2011-03-03 ENCOUNTER — Telehealth: Payer: Self-pay | Admitting: Cardiology

## 2011-04-03 ENCOUNTER — Encounter: Payer: Self-pay | Admitting: Vascular Surgery

## 2011-04-04 ENCOUNTER — Ambulatory Visit (INDEPENDENT_AMBULATORY_CARE_PROVIDER_SITE_OTHER): Payer: Federal, State, Local not specified - PPO | Admitting: Vascular Surgery

## 2011-04-04 ENCOUNTER — Encounter: Payer: Self-pay | Admitting: Vascular Surgery

## 2011-04-04 ENCOUNTER — Encounter (INDEPENDENT_AMBULATORY_CARE_PROVIDER_SITE_OTHER): Payer: Federal, State, Local not specified - PPO | Admitting: Vascular Surgery

## 2011-04-04 VITALS — BP 144/76 | HR 87 | Resp 16 | Wt 130.0 lb

## 2011-04-04 DIAGNOSIS — Z48812 Encounter for surgical aftercare following surgery on the circulatory system: Secondary | ICD-10-CM

## 2011-04-04 DIAGNOSIS — I739 Peripheral vascular disease, unspecified: Secondary | ICD-10-CM

## 2011-04-04 DIAGNOSIS — I70219 Atherosclerosis of native arteries of extremities with intermittent claudication, unspecified extremity: Secondary | ICD-10-CM

## 2011-04-04 NOTE — Progress Notes (Signed)
Subjective:     Patient ID: Walter Hinton, male   DOB: 1951-06-04, 60 y.o.   MRN: [id number]  HPI this 60 year old male patient returns for initial followup regarding his right femoral-popliteal bypass graft which I performed in February of 2012. He had gangrene of the right first toe and also had a first toe amputation performed. Prior to that he had an aortobifemoral bypass graft performed in late 2011. He fell and fractured some bones around his right patella and has not been very ambulatory recently. He denies any rest pain or history of nonhealing ulcers or infection since his toe amputation site healed.  Past Medical History  Diagnosis Date  . DM (diabetes mellitus)   . Gastroparesis   . PAD (peripheral artery disease)     Severe disease / surgery by Dr.  Hart [place] 2001-2002  . Aortic valve sclerosis     echo - 07/01/10  . Foot ulcer     non-healing  . port-a-cath     left chest  . Tobacco abuse     stopped 10 years / restarted 10/2009  . Ejection fraction     45-50% echo,06/2010, inferior and posterior hypo  . 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 Smoking status: Current Everyday Smoker    Types: Cigarettes    Last Attempt to Quit: 06/23/2010  . Smokeless tobacco: Not on file  . Alcohol Use: No    Family History  Problem Relation Age of Onset  . Diabetes      Allergies  Allergen Reactions  . Azithromycin     REACTION: Reaction not known  . Erythromycin     REACTION: Reaction not known    Current outpatient prescriptions:ALPRAZolam (XANAX) 0.25 MG tablet, Take 0.25 mg by mouth at bedtime as needed.  , Disp: , Rfl: ;  aspirin 81 MG tablet, Take 81 mg by mouth daily.  , Disp: , Rfl: ;  Cyanocobalamin (VITAMIN B 12) 100 MCG LOZG, Take by mouth.  , Disp: , Rfl: ;  gabapentin (NEURONTIN) 300 MG capsule, Take 300 mg by mouth 3 (three) times daily.  , Disp: , Rfl:    insulin glargine (LANTUS) 100 UNIT/ML injection, as directed.  , Disp: , Rfl: ;  insulin lispro (HUMALOG) 100 UNIT/ML injection, as directed.  , Disp: , Rfl: ;  lisinopril (PRINIVIL,ZESTRIL) 10 MG tablet, Take 10 mg by mouth daily.  , Disp: , Rfl: ;  ondansetron (ZOFRAN) 8 MG tablet, Take 8 mg by mouth every 8 (eight) hours as needed.  , Disp: , Rfl:  promethazine (PHENERGAN) 25 MG tablet, Take 25 mg by mouth every 6 (six) hours as needed.  , Disp: , Rfl: ;  promethazine (PHENERGAN) 25 MG/ML injection, as needed.  , Disp: , Rfl: ;  pyridOXINE (VITAMIN B-6) 25 MG tablet, Take 25 mg by mouth daily.  , Disp: , Rfl: ;  simvastatin (ZOCOR) 40 MG tablet, Take 20 mg by mouth at bedtime.  , Disp: , Rfl: ;  traZODone (DESYREL) 50 MG tablet, Take 50 mg by mouth at bedtime.  , Disp: , Rfl:  dronabinol (MARINOL) 10 MG capsule, Take 10 mg by mouth 2 (two) times daily before a meal.  , Disp: , Rfl: ;  traMADol (ULTRAM) 50 MG tablet, Take 50 mg by mouth at bedtime. , Disp: , Rfl:   BP 144/76  Pulse 87  Resp  16  Wt 130 lb (58.968 kg)  There is no height on file to calculate BMI.       Review of Systems denies chest pain dyspnea on exertion PND orthopnea urinary frequency claudication. All systems are negative the complete review of systems.     Objective:   Physical Exam blood pressure 144/76 heart rate 87 respirations 16 Gen. he is a thin well-nourished male in no apparent stress Lungs free of rhonchi or wheezing Cardiovascular regular rhythm no murmurs Abdomen soft nontender with no masses Right leg has a 3+ femoral 2-3+ popliteal pulse palpable left leg has 3+ femoral pulse palpable right first toe amputation is well healed Neurologic exam normal Skin free of rashes  Today I ordered a lower extremity scan of the right femoral-popliteal graft which I reviewed and interpreted. ABI is 0.87 on the right compared to.28 prior to his aortic surgery. The femoropopliteal in the right leg is widely patent.  There is an area of concern in the proximal portion of the vein graft which appears to be a focal stenosis possibly at the site of a hypertrophied valve. The velocity is 325 cm/s. There is also severe profundus femoris stenosis.    Assessment:     Patent aortobifemoral bypass and right femoral-popliteal bypass with possible proximal right femoropopliteal vein graft stenosis    Plan:     Angiography by Dr. Myra Gianotti on Tuesday, September 25. Possible PTA of focal right femoropopliteal vein graft stenosis if indicated.

## 2011-04-11 ENCOUNTER — Ambulatory Visit (HOSPITAL_COMMUNITY)
Admission: RE | Admit: 2011-04-11 | Discharge: 2011-04-11 | Disposition: A | Discharge status: Home / Self Care | Payer: Federal, State, Local not specified - PPO | Source: Ambulatory Visit | Attending: Surgery | Admitting: Surgery

## 2011-04-11 DIAGNOSIS — I70219 Atherosclerosis of native arteries of extremities with intermittent claudication, unspecified extremity: Secondary | ICD-10-CM

## 2011-04-11 DIAGNOSIS — Z9889 Other specified postprocedural states: Secondary | ICD-10-CM | POA: Insufficient documentation

## 2011-04-11 DIAGNOSIS — I70209 Unspecified atherosclerosis of native arteries of extremities, unspecified extremity: Secondary | ICD-10-CM | POA: Insufficient documentation

## 2011-04-11 LAB — POCT I-STAT, CHEM 8
BUN: 12 mg/dL (ref 6–23)
Chloride: 105 mEq/L (ref 96–112)
Creatinine, Ser: 0.7 mg/dL (ref 0.50–1.35)
Potassium: 3.9 mEq/L (ref 3.5–5.1)
Sodium: 138 mEq/L (ref 135–145)

## 2011-04-12 NOTE — Procedures (Unsigned)
BYPASS GRAFT EVALUATION  INDICATION:  Followup peripheral vascular disease.  HISTORY: Diabetes:  Yes. Cardiac:  No. Hypertension:  No. Smoking:  Currently. Previous Surgery:  Right common femoral to popliteal artery bypass graft with right great toe amputation on 08/18/2010; aortobifemoral bypass graft on 07/15/2010.  SINGLE LEVEL ARTERIAL EXAM                              RIGHT              LEFT Brachial: Anterior tibial: Posterior tibial: Peroneal: Ankle/brachial index:  PREVIOUS ABI:  Date:  08/08/2010  RIGHT:  0.69  LEFT:  0.75  LOWER EXTREMITY BYPASS GRAFT DUPLEX EXAM:  DUPLEX:  Greater than 75% stenosis involving the right profunda femoral artery. A 50%-75% stenosis present involving the right proximal/mid segment of the common femoral to popliteal artery bypass graft by a ratio of 2.4, and also the tibial peroneal trunk vessel.  IMPRESSION: 1. Patent distal limb of the right aortobifemoral bypass graft. 2. Patent right common femoral to popliteal artery bypass with     stenosis as noted above. 3. Native artery stenosis as noted above. 4. Right ankle brachial index 0.87 which is in the mild claudication     range. 5. Left ankle brachial index is 0.71 which is in the moderate     claudication range.  ___________________________________________ Quita Skye. Hart [place], M.D.  SH/MEDQ  D:  04/04/2011  T:  04/04/2011  Job:  [id number]

## 2011-04-15 NOTE — Op Note (Signed)
NAME:  KLARK, VANDERHOEF                ACCOUNT NO.:  [id number]  MEDICAL RECORD NO.:  [id number]  LOCATION:  [facility]                         FACILITY:  [facility]  PHYSICIAN:  Juleen China IV, MDDATE OF BIRTH:  12/27/1950  DATE OF PROCEDURE:  04/11/2011 DATE OF DISCHARGE:                              OPERATIVE REPORT   PREOPERATIVE DIAGNOSIS:  Rule out bypass graft stenosis.  POSTOPERATIVE DIAGNOSIS:  Rule out bypass graft stenosis.  PROCEDURES PERFORMED: 1. Ultrasound access of left femoral artery. 2. Abdominal aortogram. 3. Bilateral runoff. 4. First-order catheterization.  INDICATIONS:  Mr. Dinapoli is a 60 year old gentleman who has previously undergone aortobifemoral bypass graft and right femoral-popliteal bypass graft with vein.  Ultrasound revealed elevated velocities within the proximal bypass graft.  Maximal velocity was 325 cm/sec.  He comes in today for further evaluation and possible intervention.  PROCEDURE:  The patient was identified in the holding and taken to room 8, placed supine on the table.  Both groins were prepped and draped in the usual fashion.  Time-out was called.  The left femoral artery was evaluated with ultrasound and found to be widely patent.  A digital ultrasound image was acquired.  The left limb of the aortobifemoral bypass graft was then accessed under ultrasound guidance with micropuncture needle.  An 0.018 wire was advanced without resistance and a micropuncture sheath was placed.  The 0.018 wire was replaced with a Bentson wire and a 5-French sheath was placed.  Over the wire, Omniflush catheter was advanced to level L1, abdominal aortogram was obtained. Next, catheter was pulled down to the graft bifurcation and bilateral runoff was performed.  Next, using a __________ catheter was placed in the right common iliac artery and further evaluation of the right leg was performed.  FINDINGS:  Aortogram:  The visualized portions of the suprarenal  aorta showed no significant stenoses.  There are single renal arteries bilaterally which are widely patent.  There is an end-to-end aorta to bypass graft anastomosis which is widely patent.  The right and left limbs of the bypass graft are widely patent.  Left lower extremity:  The aortobifemoral graft distal anastomosis on the left is to the distal common femoral artery.  The profunda femoral artery is widely patent.  The superficial femoral artery is occluded. There is reconstitution of the distal superficial femoral artery at the level of the adductor canal.  The dominant runoff was through the anterior tibial and posterior tibial arteries.  Right lower extremity:  The right limb distal anastomosis is to the distal common femoral artery.  The profunda femoral artery appears to be widely patent.  There is a right femoral to below-knee popliteal bypass graft which is patent throughout its course.  The dominant runoff is the peroneal artery, however, the anterior tibial artery appears to take down to the ankle.  Multiple obliquities were obtained of the proximal bypass graft which correlated to the area of concern on ultrasound.  I was unable to demonstrate a hemodynamically significant stenosis within the proximal bypass graft.  There does appear to be a small filling defect approximately 5 cm distal to the aortofemoral anastomosis, however, I could not document that this was  hemodynamically significant.  I discussed this with Dr. Hart [place] and the plan at this point in time is to place him back on ultrasound protocol and have the patient come back in 3 months for further evaluation.  IMPRESSION: 1. Widely patent aortobifemoral bypass graft. 2. Occluded left superficial femoral artery. 3. Patent right femoral to below-knee popliteal artery bypass graft.     There is a focal area of irregularity within the right femoral-     popliteal bypass graft approximately 5 cm below the  proximal     anastomosis.  Multiple oblique images were obtained of this area.     I was unable to demonstrate a hemodynamically significant stenosis.     Jorge Ny, MD     VWB/MEDQ  D:  04/11/2011  T:  04/11/2011  Job:  [id number]  Electronically Signed by Arelia Longest IV MD on 04/15/2011 09:59:16 AM

## 2011-06-27 ENCOUNTER — Other Ambulatory Visit: Payer: Self-pay

## 2011-06-27 DIAGNOSIS — Z48812 Encounter for surgical aftercare following surgery on the circulatory system: Secondary | ICD-10-CM

## 2011-06-27 DIAGNOSIS — I739 Peripheral vascular disease, unspecified: Secondary | ICD-10-CM

## 2011-07-03 ENCOUNTER — Encounter: Payer: Self-pay | Admitting: Vascular Surgery

## 2011-07-04 ENCOUNTER — Ambulatory Visit (INDEPENDENT_AMBULATORY_CARE_PROVIDER_SITE_OTHER): Payer: Federal, State, Local not specified - PPO | Admitting: Vascular Surgery

## 2011-07-04 ENCOUNTER — Other Ambulatory Visit (INDEPENDENT_AMBULATORY_CARE_PROVIDER_SITE_OTHER): Payer: Federal, State, Local not specified - PPO | Admitting: *Deleted

## 2011-07-04 ENCOUNTER — Encounter: Payer: Self-pay | Admitting: Vascular Surgery

## 2011-07-04 VITALS — BP 150/86 | HR 98 | Resp 16 | Ht 69.0 in | Wt 123.0 lb

## 2011-07-04 DIAGNOSIS — I739 Peripheral vascular disease, unspecified: Secondary | ICD-10-CM

## 2011-07-04 DIAGNOSIS — I70219 Atherosclerosis of native arteries of extremities with intermittent claudication, unspecified extremity: Secondary | ICD-10-CM | POA: Insufficient documentation

## 2011-07-04 DIAGNOSIS — Z48812 Encounter for surgical aftercare following surgery on the circulatory system: Secondary | ICD-10-CM

## 2011-07-04 NOTE — Progress Notes (Signed)
Subjective:     Patient ID: Walter Hinton, male   DOB: Apr 01, 1951, 60 y.o.   MRN: [id number]  HPI this 60 year old male patient is status post right femoral-popliteal bypass graft and right first toe amputation on 08/28/2010. It appeared that he may have a vein graft stenosis and his last visit an angiogram was performed by Dr. Myra Gianotti in September of 2012. There was some very mild indentation of the proximal portion of the right femoral popliteal vein graft but no hemodynamically significant stenosis. He returns today with no symptoms of claudication. He is working in rehabilitation to improve his ambulation. He has multiple pain complaints bilaterally. His right first toe amputation has remained well healed since his surgery.  Past Medical History  Diagnosis Date  . DM (diabetes mellitus)   . Gastroparesis   . PAD (peripheral artery disease)     Severe disease / surgery by Dr.  Hart Rochester 2001-2002  . Aortic valve sclerosis     echo - 07/01/10  . Foot ulcer     non-healing  . port-a-cath     left chest  . Tobacco abuse     stopped 10 years / restarted 10/2009  . Ejection fraction     45-50% echo,06/2010, inferior and posterior hypo  . 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 Smoking status: Current Everyday Smoker    Types: Cigarettes    Last Attempt to Quit: 06/23/2010  . Smokeless tobacco: Not on file  . Alcohol Use: No    Family History  Problem Relation Age of Onset  . Diabetes      Allergies  Allergen Reactions  . Azithromycin     REACTION: Reaction not known  . Erythromycin     REACTION: Reaction not known    Current outpatient prescriptions:aspirin 81 MG tablet, Take 81 mg by mouth daily.  , Disp: , Rfl: ;  Cyanocobalamin (VITAMIN B 12) 100 MCG LOZG, Take by mouth.  , Disp: , Rfl: ;  gabapentin (NEURONTIN) 300 MG capsule, Take 300 mg by mouth 3 (three) times  daily.  , Disp: , Rfl: ;  insulin glargine (LANTUS) 100 UNIT/ML injection, as directed.  , Disp: , Rfl: ;  insulin lispro (HUMALOG) 100 UNIT/ML injection, as directed.  , Disp: , Rfl:  lisinopril (PRINIVIL,ZESTRIL) 10 MG tablet, Take 10 mg by mouth daily.  , Disp: , Rfl: ;  mirtazapine (REMERON) 15 MG tablet, Take 15 mg by mouth at bedtime.  , Disp: , Rfl: ;  ondansetron (ZOFRAN) 8 MG tablet, Take 8 mg by mouth every 8 (eight) hours as needed.  , Disp: , Rfl: ;  promethazine (PHENERGAN) 25 MG tablet, Take 25 mg by mouth every 6 (six) hours as needed.  , Disp: , Rfl:  promethazine (PHENERGAN) 25 MG/ML injection, as needed.  , Disp: , Rfl: ;  pyridOXINE (VITAMIN B-6) 25 MG tablet, Take 25 mg by mouth daily.  , Disp: , Rfl: ;  simvastatin (ZOCOR) 40 MG tablet, Take 20 mg by mouth at bedtime.  , Disp: , Rfl: ;  ALPRAZolam (XANAX) 0.25 MG tablet, Take 0.25 mg by mouth at bedtime as needed.  , Disp: , Rfl: ;  dronabinol (MARINOL) 10 MG capsule, Take 10 mg by mouth 2 (two) times daily before a meal.  , Disp: , Rfl:  traMADol (ULTRAM) 50 MG tablet, Take 50 mg by mouth at  bedtime. , Disp: , Rfl: ;  traZODone (DESYREL) 50 MG tablet, Take 50 mg by mouth at bedtime.  , Disp: , Rfl:   BP 150/86  Pulse 98  Resp 16  Ht 5\' 9"  (1.753 m)  Wt 123 lb (55.792 kg)  BMI 18.16 kg/m2  SpO2 98%  Body mass index is 18.16 kg/(m^2).             Review of Systems     Objective:   Physical Exam blood pressure today 150/86 heart rate 98 respirations 16 General well-developed well-nourished chronically ill-appearing male in no apparent distress alert and oriented x3 HEENT normal for age Lungs no rhonchi or wheezing Abdomen soft nontender no masses Right leg has a 3+ femoral 2-3+ popliteal pulse palpable a well-healed right first toe amputation site left foot is well perfused with a 3+ femoral pulse proximally  Today I ordered lower extremity arterial Dopplers which I reviewed and interpreted and a duplex scan of  the right femoral-popliteal graft. ABI is 0.95 on the right 0.88 on the left. Right side is better than last visit in September. There continues to be an area of elevated velocity in the proximal portion of the vein graft at 298 cm/s consistent with the area described above    Assessment:     Mild indentation right femoral-popliteal saphenous vein graft proximally with excellent ABI and asymptomatic-not hemodynamically significant    Plan:     Continue to follow right femoral-popliteal vein graft with intermittent duplex scanning and ABIs.

## 2011-07-12 NOTE — Procedures (Unsigned)
BYPASS GRAFT EVALUATION  INDICATION:  Status post bilateral run-off angiogram subsequent to right fem-pop graft.  HISTORY: Diabetes:  Yes. Cardiac:  No. Hypertension:  No. Smoking:  Yes. Previous Surgery:  Aortobifem graft, 06/2010; right fem-pop graft, 08/2010.  SINGLE LEVEL ARTERIAL EXAM                              RIGHT              LEFT Brachial:                    152                142 Anterior tibial:             145                134 Posterior tibial:            134                108 Peroneal: Ankle/brachial index:        0.95               0.88  PREVIOUS ABI:  Date: 04/04/11  RIGHT:  0.87  LEFT:  0.71  LOWER EXTREMITY BYPASS GRAFT DUPLEX EXAM:  DUPLEX: 1. Widely patent right fem-pop graft without evidence of restenosis or     hyperplasia. 2. Focally elevated velocities are present at the proximal segment;     however, no disease is visualized, and there is minimal spectral     broadening distally.  Suspect sclerotic valve cusp remnant. 3. Diffuse disease is seen in the native posterior tibial artery.  IMPRESSION:  Widely patent right femoropopliteal graft with elevated velocities, as described above.   ___________________________________________ Quita Skye. Hart Rochester, M.D.  LT/MEDQ  D:  07/04/2011  T:  07/04/2011  Job:  [id number]

## 2011-08-17 ENCOUNTER — Ambulatory Visit: Payer: Federal, State, Local not specified - PPO | Admitting: Cardiology

## 2011-08-18 ENCOUNTER — Encounter: Payer: Self-pay | Admitting: Cardiology

## 2011-08-18 ENCOUNTER — Ambulatory Visit (INDEPENDENT_AMBULATORY_CARE_PROVIDER_SITE_OTHER): Payer: Federal, State, Local not specified - PPO | Admitting: Cardiology

## 2011-08-18 VITALS — BP 132/64 | HR 78 | Ht 69.0 in | Wt 131.0 lb

## 2011-08-18 DIAGNOSIS — I779 Disorder of arteries and arterioles, unspecified: Secondary | ICD-10-CM

## 2011-08-18 DIAGNOSIS — I739 Peripheral vascular disease, unspecified: Secondary | ICD-10-CM

## 2011-08-18 DIAGNOSIS — Z720 Tobacco use: Secondary | ICD-10-CM

## 2011-08-18 DIAGNOSIS — I2510 Atherosclerotic heart disease of native coronary artery without angina pectoris: Secondary | ICD-10-CM

## 2011-08-18 DIAGNOSIS — F17200 Nicotine dependence, unspecified, uncomplicated: Secondary | ICD-10-CM

## 2011-08-18 NOTE — Assessment & Plan Note (Signed)
Patient is smoking three quarters of a pack per day. I have counseled him to stop.

## 2011-08-18 NOTE — Patient Instructions (Signed)
Your physician wants you to follow-up in: 9 months You will receive a reminder letter in the mail two months in advance. If you don't receive a letter, please call our office to schedule the follow-up appointment.  

## 2011-08-18 NOTE — Assessment & Plan Note (Signed)
The patient has presumed coronary disease. Nuclear scan in December, 2011 revealed a large inferior scar with partial reversibility. The patient has not had marked symptoms. He does not want further aggressive workup at this time. Secondary prevention is key.

## 2011-08-18 NOTE — Progress Notes (Signed)
HPI Patient is seen for followup of coronary artery disease. I saw him last April, 2012. At that time he was status post vascular surgery. We know that his ejection fraction is 45% by echo. He saw Dr. Hart Rochester recently for followup of vascular surgery. It is noted that he has mild indentation of the right femoropopliteal saphenous vein graft approximately. There is an excellent ABI and he is asymptomatic. It is felt to be not hemodynamically significant.  The patient is not having any significant chest pain. He has rare fleeting pains which are nonexertional and do not sound like angina. Unfortunately he continues to smoke. We had a long discussion about this. Allergies  Allergen Reactions  . Azithromycin     REACTION: Reaction not known  . Erythromycin     REACTION: Reaction not known    Current Outpatient Prescriptions  Medication Sig Dispense Refill  . aspirin 81 MG tablet Take 81 mg by mouth daily.        . Cyanocobalamin (VITAMIN B 12) 100 MCG LOZG Take by mouth.        . gabapentin (NEURONTIN) 300 MG capsule Take 300 mg by mouth 3 (three) times daily.        Marland Kitchen HYDROcodone-acetaminophen (VICODIN) 5-500 MG per tablet Take 1 tablet by mouth as needed.      . insulin glargine (LANTUS) 100 UNIT/ML injection as directed.        . mirtazapine (REMERON) 15 MG tablet Take 15 mg by mouth at bedtime.        Marland Kitchen NOVOLOG 100 UNIT/ML injection as directed.      . ondansetron (ZOFRAN) 8 MG tablet Take 8 mg by mouth every 8 (eight) hours as needed.        . promethazine (PHENERGAN) 25 MG tablet Take 25 mg by mouth every 6 (six) hours as needed.        . promethazine (PHENERGAN) 25 MG/ML injection as needed.        . pyridOXINE (VITAMIN B-6) 25 MG tablet Take 25 mg by mouth daily.        . simvastatin (ZOCOR) 40 MG tablet Take 20 mg by mouth at bedtime.          History   Social History  . Marital Status: Married    Spouse Name: N/A    Number of Children: N/A  . Years of Education: N/A    Occupational History  . Not on file.   Social History Main Topics  . Smoking status: Current Everyday Smoker    Types: Cigarettes    Last Attempt to Quit: 06/23/2010  . Smokeless tobacco: Not on file  . Alcohol Use: No  . Drug Use: Yes    Special: Marijuana  . Sexually Active: Not on file   Other Topics Concern  . Not on file   Social History Narrative  . No narrative on file    Family History  Problem Relation Age of Onset  . Diabetes      Past Medical History  Diagnosis Date  . DM (diabetes mellitus)   . Gastroparesis   . PAD (peripheral artery disease)     Severe disease / surgery by Dr.  Hart Rochester 2001-2002  . Aortic valve sclerosis     echo - 07/01/10  . Foot ulcer     non-healing  . port-a-cath     left chest  . Tobacco abuse     stopped 10 years / restarted 10/2009  . Ejection fraction  45-50% echo,06/2010, inferior and posterior hypo  . CAD (coronary artery disease)     Presumed CAD, nuclear 06/2010,large inferior scar with partial reversibility    Past Surgical History  Procedure Date  . Knee surgery     miniscus -- right  . Cataract extraction   . Cholecystectomy   . Tonsillectomy   . Vasectomy     ROS  Patient denies fever, chills, headache, sweats, rash, change in vision, change in hearing, chest pain, cough, nausea vomiting, urinary symptoms. All other systems are reviewed and are negative.  PHYSICAL EXAM Patient is stable today. The patient and his wife both smell of cigarette smoke. They both admit that there smoking. The wife smokes outside. Patient is oriented to person time and place. Affect is normal. Lungs are clear. Respiratory effort is nonlabored. The patient is thin but stable. Lungs are clear. Respiratory effort is nonlabored. Cardiac exam reveals S1 and S2. There no clicks or significant murmurs. The abdomen is soft. There is no significant peripheral edema. There no skin rashes. There no musculoskeletal deformities.  Filed  Vitals:   08/18/11 1547  BP: 132/64  Pulse: 78  Height: 5\' 9"  (1.753 m)  Weight: 131 lb (59.421 kg)    EKG EKG is done today and reviewed by me. The patient has inferior Q waves. This is unchanged. Otherwise there is no change in his EKG. ASSESSMENT & PLAN

## 2011-08-18 NOTE — Assessment & Plan Note (Signed)
Patient's disease is followed carefully by Dr. Hart Rochester. No further workup.

## 2011-08-24 ENCOUNTER — Telehealth: Payer: Self-pay | Admitting: Cardiology

## 2011-08-24 NOTE — Telephone Encounter (Signed)
LOV,Strees faxed to [facility] @ [phone] 08/24/11/KM

## 2012-01-01 ENCOUNTER — Encounter: Payer: Self-pay | Admitting: Neurosurgery

## 2012-01-02 ENCOUNTER — Encounter (INDEPENDENT_AMBULATORY_CARE_PROVIDER_SITE_OTHER): Payer: Federal, State, Local not specified - PPO

## 2012-01-02 ENCOUNTER — Encounter: Payer: Self-pay | Admitting: Neurosurgery

## 2012-01-02 ENCOUNTER — Ambulatory Visit (INDEPENDENT_AMBULATORY_CARE_PROVIDER_SITE_OTHER): Payer: Federal, State, Local not specified - PPO | Admitting: Neurosurgery

## 2012-01-02 VITALS — BP 144/87 | HR 87 | Resp 18 | Ht 69.0 in | Wt 129.0 lb

## 2012-01-02 DIAGNOSIS — Z48812 Encounter for surgical aftercare following surgery on the circulatory system: Secondary | ICD-10-CM

## 2012-01-02 DIAGNOSIS — I70219 Atherosclerosis of native arteries of extremities with intermittent claudication, unspecified extremity: Secondary | ICD-10-CM

## 2012-01-02 DIAGNOSIS — I739 Peripheral vascular disease, unspecified: Secondary | ICD-10-CM

## 2012-01-02 NOTE — Progress Notes (Signed)
[facility] Office Note  CC: Six-month lower extremity graft duplex and ABI Referring Physician: Hart Rochester  History of Present Illness: 61 year old male patient of Dr. Candie Chroman that is status postright femoral-popliteal bypass graft and right first toe amputation on 08/28/2010. The patient reports his ambulation to be improving constantly. He is comfortable wheelchair to a walker to obtain. He is walking more and he states his legs get tired but they don't really "hurt". The patient denies rest pain and has no open wounds on his lower extremities. He does complain of a small "knot" right along his abdominal suture line and I reassured him this is probably scar tissue around the Prolene suture itself.   Past Medical History  Diagnosis Date  . DM (diabetes mellitus)   . Gastroparesis   . PAD (peripheral artery disease)     Severe disease / surgery by Dr.  Hart Rochester 2001-2002  . Aortic valve sclerosis     echo - 07/01/10  . Foot ulcer     non-healing  . port-a-cath     left chest  . Tobacco abuse     stopped 10 years / restarted 10/2009  . Ejection fraction     45-50% echo,06/2010, inferior and posterior hypo  . CAD (coronary artery disease)     Presumed CAD, nuclear 06/2010,large inferior scar with partial reversibility    ROS: [x]  Positive   [ ]  Denies    General: [ ]  Weight loss, [ ]  Fever, [ ]  chills Neurologic: [ ]  Dizziness, [ ]  Blackouts, [ ]  Seizure [ ]  Stroke, [ ]  "Mini stroke", [ ]  Slurred speech, [ ]  Temporary blindness; [ ]  weakness in arms or legs, [ ]  Hoarseness Cardiac: [ ]  Chest pain/pressure, [ ]  Shortness of breath at rest [ ]  Shortness of breath with exertion, [ ]  Atrial fibrillation or irregular heartbeat Vascular: [ ]  Pain in legs with walking, [ ]  Pain in legs at rest, [ ]  Pain in legs at night,  [ ]  Non-healing ulcer, [ ]  Blood clot in vein/DVT,   Pulmonary: [ ]  Home oxygen, [ ]  Productive cough, [ ]  Coughing up blood, [ ]   Asthma,  [ ]  Wheezing Musculoskeletal:  [ ]  Arthritis, [ ]  Low back pain, [ ]  Joint pain Hematologic: [ ]  Easy Bruising, [ ]  Anemia; [ ]  Hepatitis Gastrointestinal: [ ]  Blood in stool, [ ]  Gastroesophageal Reflux/heartburn, [ ]  Trouble swallowing Urinary: [ ]  chronic Kidney disease, [ ]  on HD - [ ]  MWF or [ ]  TTHS, [ ]  Burning with urination, [ ]  Difficulty urinating Skin: [ ]  Rashes, [ ]  Wounds Psychological: [ ]  Anxiety, [ ]  Depression   Social History History  Substance Use Topics  . Smoking status: Current Everyday Smoker    Types: Cigarettes    Last Attempt to Quit: 06/23/2010  . Smokeless tobacco: Not on file  . Alcohol Use: No    Family History Family History  Problem Relation Age of Onset  . Diabetes      Allergies  Allergen Reactions  . Azithromycin     REACTION: Reaction not known  . Erythromycin     REACTION: Reaction not known    Current Outpatient Prescriptions  Medication Sig Dispense Refill  . aspirin 81 MG tablet Take 81 mg by mouth daily.        . Cyanocobalamin (VITAMIN B 12) 100 MCG LOZG Take by mouth.        . gabapentin (NEURONTIN) 300 MG capsule  Take 300 mg by mouth 3 (three) times daily.        Marland Kitchen HYDROcodone-acetaminophen (VICODIN) 5-500 MG per tablet Take 1 tablet by mouth as needed.      . insulin glargine (LANTUS) 100 UNIT/ML injection as directed.        . mirtazapine (REMERON) 15 MG tablet Take 15 mg by mouth at bedtime.        Marland Kitchen NOVOLOG 100 UNIT/ML injection as directed.      . ondansetron (ZOFRAN) 8 MG tablet Take 8 mg by mouth every 8 (eight) hours as needed.        . promethazine (PHENERGAN) 25 MG tablet Take 25 mg by mouth every 6 (six) hours as needed.        . promethazine (PHENERGAN) 25 MG/ML injection as needed.        . pyridOXINE (VITAMIN B-6) 25 MG tablet Take 25 mg by mouth daily.        . simvastatin (ZOCOR) 40 MG tablet Take 20 mg by mouth at bedtime.          Physical Examination  There were no vitals filed for this  visit.  There is no height or weight on file to calculate BMI.  General:  WDWN in NAD Gait: Normal HEENT: WNL Eyes: Pupils equal Pulmonary: normal non-labored breathing , without Rales, rhonchi,  wheezing Cardiac: RRR, without  Murmurs, rubs or gallops; No carotid bruits Abdomen: soft, NT, no masses Skin: no rashes, ulcers noted Vascular Exam/Pulses: Patient has palpable femoral pulses as well as popliteal pulses lower extremity pulses are not palpated, no carotid bruits are heard  Extremities without ischemic changes, no Gangrene , no cellulitis; no open wounds;  Musculoskeletal: no muscle wasting or atrophy  Neurologic: A&O X 3; Appropriate Affect ; SENSATION: normal; MOTOR FUNCTION:  moving all extremities equally. Speech is fluent/normal  Non-Invasive Vascular Imaging: Lower extremity bypass graft duplex shows some mild elevation proximal to the proximal anastomosis. ABIs today are 0.83 and monophasic on the right, 0.85 and monophasic on the left, the right has dropped from 0.95 in December 2012. I discussed this with Dr. Hart Rochester who states we should keep the patient on the 6 month protocol for surveillance.  ASSESSMENT/PLAN: Assessment as above, plan will be for the patient return in 6 months with repeat graft duplex and ABIs. His questions were encouraged and answered, he is in agreement with this plan.  Lauree Chandler ANP  Clinic M.D.: Hart Rochester

## 2012-01-03 NOTE — Addendum Note (Signed)
Addended by: Sharee Pimple on: 01/03/2012 08:40 AM   Modules accepted: Orders

## 2012-01-10 NOTE — Procedures (Unsigned)
BYPASS GRAFT EVALUATION  INDICATION:  Peripheral arterial disease.  HISTORY: Diabetes:  Yes. Cardiac: Hypertension:  No. Smoking:  Previously. Previous Surgery:  Right femoral to below-knee popliteal artery bypass graft with saphenous vein on 08/18/10.  SINGLE LEVEL ARTERIAL EXAM                              RIGHT              LEFT Brachial: Anterior tibial: Posterior tibial: Peroneal: Ankle/brachial index:        0.83               0.85  PREVIOUS ABI:  Date: 07/04/11  RIGHT:  0.95  LEFT:  0.88  LOWER EXTREMITY BYPASS GRAFT DUPLEX EXAM:  DUPLEX:  Patent right and left limb of the aortobifemoral bypass graft. The right femoropopliteal bypass grafting is patent with mildly elevated velocities in the proximal segment of the vein graft.  Velocities today are not as high as on the previous examination performed on 07/04/11. No flow is noted in the distal anterior or posterior tibial arteries. Monophasic dampened flow is present in the right distal peroneal artery.  IMPRESSION: 1. Patent right and left limb of the aortobifemoral bypass graft. 2. Patent right femoral-popliteal bypass graft with mildly elevated     velocities in the proximal segment.  This segment of the graft     appears to be smaller in caliber than the remainder of the graft. 3. Right tibial artery occlusive disease.  ___________________________________________ Quita Skye. Hart Rochester, M.D.  CI/MEDQ  D:  01/02/2012  T:  01/02/2012  Job:  [id number]

## 2012-07-03 ENCOUNTER — Encounter: Payer: Self-pay | Admitting: Neurosurgery

## 2012-07-04 ENCOUNTER — Ambulatory Visit: Payer: Federal, State, Local not specified - PPO | Admitting: Neurosurgery

## 2012-07-05 ENCOUNTER — Encounter: Payer: Self-pay | Admitting: Neurosurgery

## 2012-07-08 ENCOUNTER — Ambulatory Visit: Payer: Federal, State, Local not specified - PPO | Admitting: Neurosurgery

## 2012-07-11 IMAGING — CR DG FOOT COMPLETE 3+V*R*
1 series · 1 of 1 positions shown · non-contrast
Comparison: None available.

CLINICAL DATA: Fall.  Pain in the lower extremities bilaterally to

RIGHT FOOT COMPLETE - 3+ VIEW

[view not recorded]
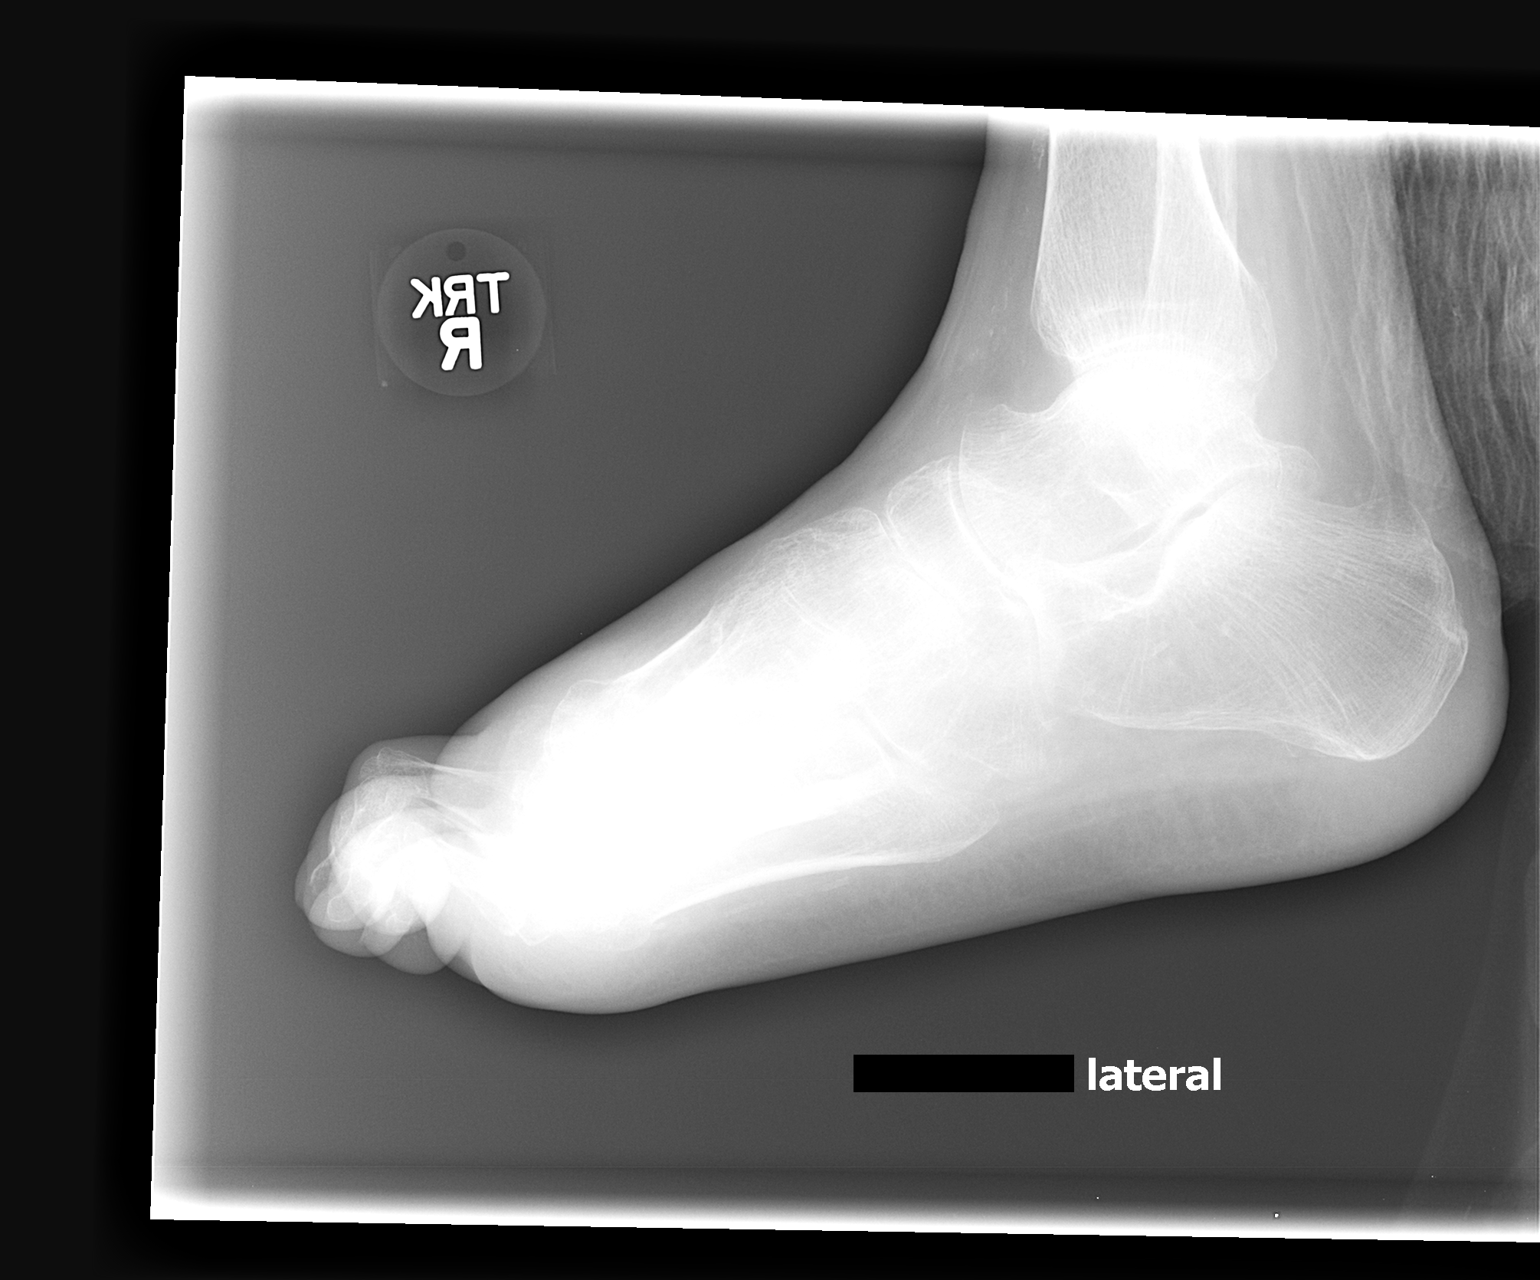

[1 of 1 positions shown; findings below may reference images not displayed]

FINDINGS: The the patient is status post amputation of the right
great toe leaving only the base of the proximal phalanx.  Diffuse
osteopenia is noted.  No acute fracture is evident.  No focal soft
tissue abnormality is seen.
IMPRESSION: 1.  Status post amputation of the great toe.
2.  No acute abnormality.

## 2012-07-22 ENCOUNTER — Ambulatory Visit: Payer: Federal, State, Local not specified - PPO | Admitting: Neurosurgery

## 2012-08-12 ENCOUNTER — Encounter: Payer: Self-pay | Admitting: Vascular Surgery

## 2012-08-13 ENCOUNTER — Ambulatory Visit: Payer: Federal, State, Local not specified - PPO | Admitting: Vascular Surgery

## 2012-09-23 ENCOUNTER — Encounter: Payer: Self-pay | Admitting: Vascular Surgery

## 2012-09-24 ENCOUNTER — Encounter (INDEPENDENT_AMBULATORY_CARE_PROVIDER_SITE_OTHER): Payer: Federal, State, Local not specified - PPO | Admitting: *Deleted

## 2012-09-24 ENCOUNTER — Ambulatory Visit (INDEPENDENT_AMBULATORY_CARE_PROVIDER_SITE_OTHER): Payer: Federal, State, Local not specified - PPO | Admitting: Vascular Surgery

## 2012-09-24 ENCOUNTER — Encounter: Payer: Self-pay | Admitting: Vascular Surgery

## 2012-09-24 VITALS — BP 140/66 | HR 69 | Resp 20 | Ht 68.5 in | Wt 131.0 lb

## 2012-09-24 DIAGNOSIS — I739 Peripheral vascular disease, unspecified: Secondary | ICD-10-CM

## 2012-09-24 DIAGNOSIS — Z48812 Encounter for surgical aftercare following surgery on the circulatory system: Secondary | ICD-10-CM

## 2012-09-24 NOTE — Progress Notes (Signed)
Subjective:     Patient ID: Walter Hinton, male   DOB: [birth year], 62 y.o.   MRN: [id number]  HPI this 62 year old male returns for further followup regarding his right femoral popliteal vein graft to placed in 2012. He also has an aortobifemoral bypass. There was a concern about an area of increased velocity in the proximal portion of the right femoral popliteal vein graft. Patient does not ambulate long distances and denies any significant claudication symptoms. He's had a previous first toe amputation which remained well healed. Upon review of the records and 2012 Dr. Myra Gianotti performed an angiogram for this very reason to look for a vein graft stenosis in the proximal portion of the vein and it did not appear that the patient had a hypertrophied valve requiring PTA.  Past Medical History  Diagnosis Date  . DM (diabetes mellitus)   . Gastroparesis   . PAD (peripheral artery disease)     Severe disease / surgery by Dr.  Hart Rochester 2001-2002  . Aortic valve sclerosis     echo - 07/01/10  . Foot ulcer     non-healing  . port-a-cath     left chest  . Tobacco abuse     stopped 10 years / restarted 10/2009  . Ejection fraction     45-50% echo,06/2010, inferior and posterior hypo  . 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 Smoking status: Former Smoker    Types: Cigarettes    Quit date: 09/22/2011  . Smokeless tobacco: Never Used  . Alcohol Use: Yes    Family History  Problem Relation Age of Onset  . Diabetes      Allergies  Allergen Reactions  . Azithromycin     REACTION: Reaction not known  . Erythromycin     REACTION: Reaction not known    Current outpatient prescriptions:aspirin 81 MG tablet, Take 81 mg by mouth daily.  , Disp: , Rfl: ;  Cyanocobalamin (VITAMIN B 12) 100 MCG LOZG, Take by mouth.  , Disp: , Rfl: ;  FLUoxetine (PROZAC) 10 MG capsule, Take 10 mg by mouth  daily., Disp: , Rfl: ;  gabapentin (NEURONTIN) 300 MG capsule, Take 300 mg by mouth 3 (three) times daily.  , Disp: , Rfl: ;  insulin glargine (LANTUS) 100 UNIT/ML injection, as directed.  , Disp: , Rfl:  NOVOLOG 100 UNIT/ML injection, as directed., Disp: , Rfl: ;  ondansetron (ZOFRAN) 8 MG tablet, Take 8 mg by mouth every 8 (eight) hours as needed.  , Disp: , Rfl: ;  promethazine (PHENERGAN) 25 MG tablet, Take 25 mg by mouth every 6 (six) hours as needed.  , Disp: , Rfl: ;  promethazine (PHENERGAN) 25 MG/ML injection, as needed.  , Disp: , Rfl: ;  pyridOXINE (VITAMIN B-6) 25 MG tablet, Take 25 mg by mouth daily.  , Disp: , Rfl:  simvastatin (ZOCOR) 40 MG tablet, Take 20 mg by mouth at bedtime.  , Disp: , Rfl: ;  HYDROcodone-acetaminophen (VICODIN) 5-500 MG per tablet, Take 1 tablet by mouth as needed., Disp: , Rfl: ;  hydrOXYzine (VISTARIL) 25 MG capsule, Take 2 tablets by mouth Twice daily., Disp: , Rfl: ;  mirtazapine (REMERON) 15 MG tablet, Take 15 mg by mouth at bedtime.  , Disp: , Rfl:   BP 140/66  Pulse 69  Resp 20  Ht 5' 8.5" (1.74 m)  Wt 131 lb (  59.421 kg)  BMI 19.63 kg/m2  Body mass index is 19.63 kg/(m^2).           Review of Systems Denies chest pain, dyspnea on exertion, PND, orthopnea, hemoptysis. Is very unsteady gait because of his previous toe agitation he states.      Objective:   Physical Examblood pressure 140/66 heart rate 69 respirations 20 Gen.-alert and oriented x3 in no apparent distress HEENT normal for age Lungs no rhonchi or wheezing Cardiovascular regular rhythm no murmurs carotid pulses 3+ palpable no bruits audible Abdomen soft nontender no palpable masses Musculoskeletal free of  major deformities Skin clear -no rashes Neurologic normal Lower extremities 3+ femoral and And 2+ popliteal pulses palpable bilaterally with no edema -no evidence of ischemia  Today I ordered a duplex scan of his right lower extremity. There is an area in the proximal  portion of the vein graft consistent with the same area Dr. Myra Gianotti studied 18 months ago where there is elevated velocity of 388 cm/s. This has been as high as 325 cm/s in the past. ABI remains stable at 0.92 on the right 0.84 on the left.       Assessment:     Focal area of increased velocity proximal portion right femoral popliteal vein graft-has been previously studied by angiography and not found to be hypertrophied valve requiring PTA-patient asymptomatic     Plan:     Will return in 6 months for repeat duplex scan right femoral popliteal vein graft and check ABIs

## 2012-09-25 NOTE — Addendum Note (Signed)
Addended by: Sharee Pimple on: 09/25/2012 11:13 AM   Modules accepted: Orders

## 2013-03-31 ENCOUNTER — Encounter: Payer: Self-pay | Admitting: Vascular Surgery

## 2013-04-01 ENCOUNTER — Ambulatory Visit: Payer: Federal, State, Local not specified - PPO | Admitting: Vascular Surgery

## 2013-04-07 ENCOUNTER — Encounter: Payer: Self-pay | Admitting: Vascular Surgery

## 2013-04-08 ENCOUNTER — Ambulatory Visit: Payer: Federal, State, Local not specified - PPO | Admitting: Vascular Surgery

## 2013-05-05 ENCOUNTER — Encounter: Payer: Self-pay | Admitting: Vascular Surgery

## 2013-05-06 ENCOUNTER — Ambulatory Visit (HOSPITAL_COMMUNITY)
Admission: RE | Admit: 2013-05-06 | Discharge: 2013-05-06 | Disposition: A | Discharge status: Home / Self Care | Payer: Federal, State, Local not specified - PPO | Source: Ambulatory Visit | Attending: Vascular Surgery | Admitting: Vascular Surgery

## 2013-05-06 ENCOUNTER — Ambulatory Visit (INDEPENDENT_AMBULATORY_CARE_PROVIDER_SITE_OTHER): Payer: Federal, State, Local not specified - PPO | Admitting: Vascular Surgery

## 2013-05-06 ENCOUNTER — Ambulatory Visit (INDEPENDENT_AMBULATORY_CARE_PROVIDER_SITE_OTHER)
Admission: RE | Admit: 2013-05-06 | Discharge: 2013-05-06 | Disposition: A | Discharge status: Home / Self Care | Payer: Federal, State, Local not specified - PPO | Source: Ambulatory Visit | Attending: Vascular Surgery | Admitting: Vascular Surgery

## 2013-05-06 ENCOUNTER — Encounter: Payer: Self-pay | Admitting: Vascular Surgery

## 2013-05-06 ENCOUNTER — Encounter (INDEPENDENT_AMBULATORY_CARE_PROVIDER_SITE_OTHER): Payer: Self-pay

## 2013-05-06 VITALS — BP 138/70 | HR 67 | Resp 18 | Ht 68.5 in | Wt 128.0 lb

## 2013-05-06 DIAGNOSIS — I739 Peripheral vascular disease, unspecified: Secondary | ICD-10-CM

## 2013-05-06 DIAGNOSIS — Z48812 Encounter for surgical aftercare following surgery on the circulatory system: Secondary | ICD-10-CM

## 2013-05-06 NOTE — Addendum Note (Signed)
Addended by: Adria Dill L on: 05/06/2013 12:56 PM   Modules accepted: Orders

## 2013-05-06 NOTE — Progress Notes (Signed)
Subjective:     Patient ID: Walter Hinton, male   DOB: April 16, 1951, 62 y.o.   MRN: [id number]  HPI this 62 year old male returns for continued followup regarding his aortobifemoral bypass graft and right femoral popliteal saphenous vein graft which were performed and 2012. 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 His biggest complaint today is bilateral knee pain which limits his walking. He also has developed some pressure sores on the dorsum of toes of both feet do to extrinsic compression from the shoes.  Past Medical History  Diagnosis Date  . DM (diabetes mellitus)   . Gastroparesis   . PAD (peripheral artery disease)     Severe disease / surgery by Dr.  Hart [place] 2001-2002  . Aortic valve sclerosis     echo - 07/01/10  . Foot ulcer     non-healing  . port-a-cath     left chest  . Tobacco abuse     stopped 10 years / restarted 10/2009  . Ejection fraction     45-50% echo,06/2010, inferior and posterior hypo  . 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 Smoking status: Former Smoker    Types: Cigarettes    Quit date: 09/22/2011  . Smokeless tobacco: Never Used  . Alcohol Use: Yes    Family History  Problem Relation Age of Onset  . Diabetes      Allergies  Allergen Reactions  . Azithromycin     REACTION: Reaction not known  . Erythromycin     REACTION: Reaction not known    Current outpatient prescriptions:aspirin 81 MG tablet, Take 81 mg by mouth daily.  , Disp: , Rfl: ;  Cyanocobalamin (VITAMIN B 12) 100 MCG LOZG, Take by mouth.  , Disp: , Rfl: ;  FLUoxetine (PROZAC) 10 MG capsule, Take 10 mg by mouth daily., Disp: , Rfl: ;  gabapentin (NEURONTIN) 300 MG capsule, Take 300 mg by mouth 3 (three) times daily.  , Disp: , Rfl: ;  hydrOXYzine (VISTARIL) 25 MG capsule, Take 2 tablets by mouth Twice daily., Disp: , Rfl:  insulin glargine (LANTUS)  100 UNIT/ML injection, as directed.  , Disp: , Rfl: ;  NOVOLOG 100 UNIT/ML injection, as directed., Disp: , Rfl: ;  ondansetron (ZOFRAN) 8 MG tablet, Take 8 mg by mouth every 8 (eight) hours as needed.  , Disp: , Rfl: ;  promethazine (PHENERGAN) 25 MG tablet, Take 25 mg by mouth every 6 (six) hours as needed.  , Disp: , Rfl: ;  promethazine (PHENERGAN) 25 MG/ML injection, as needed.  , Disp: , Rfl:  pyridOXINE (VITAMIN B-6) 25 MG tablet, Take 25 mg by mouth daily.  , Disp: , Rfl: ;  simvastatin (ZOCOR) 40 MG tablet, Take 20 mg by mouth at bedtime.  , Disp: , Rfl: ;  HYDROcodone-acetaminophen (VICODIN) 5-500 MG per tablet, Take 1 tablet by mouth as needed., Disp: , Rfl: ;  mirtazapine (REMERON) 15 MG tablet, Take 15 mg by mouth at bedtime.  , Disp: , Rfl:   BP 138/70  Pulse 67  Resp 18  Ht 5' 8.5" (1.74 m)  Wt 128 lb (58.06 kg)  BMI 19.18 kg/m2  Body mass index is 19.18 kg/(m^2).          Review of Systems complains of leg pain with walking, severe bilateral knee pain, weakness and numbness in the legs. All  other systems negative and complete review of systems    Objective:   Physical Exam BP 138/70  Pulse 67  Resp 18  Ht 5' 8.5" (1.74 m)  Wt 128 lb (58.06 kg)  BMI 19.18 kg/m2  Gen.-alert and oriented x3 in no apparent distress HEENT normal for age Lungs no rhonchi or wheezing Cardiovascular regular rhythm no murmurs carotid pulses 3+ palpable no bruits audible Abdomen soft nontender no palpable masses Musculoskeletal free of  major deformities Skin clear -no rashes Neurologic normal Lower extremities 3+ femoral and 2+ popliteal graft pulse on the right. 3+ femoral pulse on left. Both feet adequately perfused. Right first toe" well healed. Superficial ulceration on the dorsal aspect of right second toe and left third of which appears to be pressure related from his shoes. Today I ordered lower extremity arterial Doppler studies and ABIs. ABI on the right is 0.95 and on the left  is 0.88. Bypass graft in the right leg is widely patent in the area of suspected narrowing in the proximal portion of the vein graft appears better today with elevated velocity of 288 cm per sec        Assessment:     Widely patent right femoral-popliteal bypass with no evidence of proximal vein graft stenosis today    Plan:     Return in one year for followup a duplex scan of right femoral-popliteal vein graft and ABIs Return to podiatrist for further fitting of shoes to avoid pressure on toes

## 2013-05-22 ENCOUNTER — Other Ambulatory Visit: Payer: Self-pay

## 2014-05-01 NOTE — Telephone Encounter (Signed)
Nothing was typed/tmj 

## 2014-05-12 ENCOUNTER — Ambulatory Visit: Payer: Federal, State, Local not specified - PPO | Admitting: Family

## 2014-05-12 ENCOUNTER — Other Ambulatory Visit (HOSPITAL_COMMUNITY): Payer: Federal, State, Local not specified - PPO

## 2014-05-12 ENCOUNTER — Encounter (HOSPITAL_COMMUNITY): Payer: Federal, State, Local not specified - PPO

## 2014-08-17 DEATH — deceased

## 2015-01-11 ENCOUNTER — Other Ambulatory Visit: Payer: Self-pay
